# Patient Record
Sex: Female | Born: 1975 | Race: White | Hispanic: No | Marital: Single | Smoking: Never smoker
Health system: Southern US, Community
[De-identification: ages and names within clinical notes are randomized; demographics above are authoritative.]

## PROBLEM LIST (undated history)

## (undated) DIAGNOSIS — R600 Localized edema: Secondary | ICD-10-CM

## (undated) DIAGNOSIS — E785 Hyperlipidemia, unspecified: Secondary | ICD-10-CM

## (undated) DIAGNOSIS — K219 Gastro-esophageal reflux disease without esophagitis: Secondary | ICD-10-CM

## (undated) DIAGNOSIS — E049 Nontoxic goiter, unspecified: Secondary | ICD-10-CM

## (undated) DIAGNOSIS — B977 Papillomavirus as the cause of diseases classified elsewhere: Secondary | ICD-10-CM

## (undated) DIAGNOSIS — D721 Eosinophilia, unspecified: Secondary | ICD-10-CM

## (undated) DIAGNOSIS — A630 Anogenital (venereal) warts: Secondary | ICD-10-CM

## (undated) HISTORY — PX: WISDOM TOOTH EXTRACTION: SHX21

## (undated) HISTORY — PX: UPPER GASTROINTESTINAL ENDOSCOPY: SHX188

## (undated) HISTORY — PX: ESOPHAGOGASTRODUODENOSCOPY ENDOSCOPY: SHX5814

## (undated) HISTORY — DX: Eosinophilia, unspecified: D72.10

## (undated) HISTORY — PX: OTHER SURGICAL HISTORY: SHX169

## (undated) HISTORY — PX: EYE SURGERY: SHX253

## (undated) HISTORY — DX: Nontoxic goiter, unspecified: E04.9

## (undated) HISTORY — DX: Papillomavirus as the cause of diseases classified elsewhere: B97.7

## (undated) HISTORY — DX: Eosinophilia: D72.1

## (undated) HISTORY — DX: Gastro-esophageal reflux disease without esophagitis: K21.9

---

## 2013-02-16 ENCOUNTER — Ambulatory Visit (INDEPENDENT_AMBULATORY_CARE_PROVIDER_SITE_OTHER): Payer: BC Managed Care – PPO | Admitting: Advanced Practice Midwife

## 2013-02-16 ENCOUNTER — Encounter: Payer: Self-pay | Admitting: Advanced Practice Midwife

## 2013-02-16 VITALS — BP 112/69 | HR 88 | Resp 16 | Ht 64.0 in | Wt 133.0 lb

## 2013-02-16 DIAGNOSIS — Z3009 Encounter for other general counseling and advice on contraception: Secondary | ICD-10-CM

## 2013-02-16 DIAGNOSIS — Z113 Encounter for screening for infections with a predominantly sexual mode of transmission: Secondary | ICD-10-CM

## 2013-02-16 MED ORDER — MISOPROSTOL 200 MCG PO TABS: 600.0000 ug | ORAL_TABLET | Freq: Once | ORAL | Status: DC

## 2013-02-16 NOTE — Progress Notes (Signed)
  This is a 37 y.o. female G0P0 who is here for a contraception consult  Has used OCPs in past, but had weight gain and does not want to continue them.   She states she never wants to become pregnant. Wants a long-acting method of contraception  Has read up on various methods and wants to have Mirena IUD inserted  We discussed risks/benefits.  Discussed  Alternative of using Skyla, but patient wishes to try Mirena instead.  We will plan to have her insert cytotec the night before and take ibuprofen that morning.   Instructed her that we may have trouble getting it placed, she is still wanting to try.   Wants STD testing prior to insertion. Cultures done and bloodwork drawn. Also wants HSV glycoprotein testing.

## 2013-02-16 NOTE — Patient Instructions (Signed)

## 2013-02-17 LAB — HEPATITIS B SURFACE ANTIGEN: Hepatitis B Surface Ag: NEGATIVE

## 2013-02-17 LAB — HSV 2 ANTIBODY, IGG: HSV 2 Glycoprotein G Ab, IgG: <0.1 IV

## 2013-02-17 LAB — GC/CHLAMYDIA PROBE AMP, URINE: Chlamydia, Swab/Urine, PCR: NEGATIVE

## 2013-02-17 LAB — HSV 1 ANTIBODY, IGG: HSV 1 Glycoprotein G Ab, IgG: 0.35 IV

## 2013-02-21 ENCOUNTER — Encounter: Payer: Self-pay | Admitting: Advanced Practice Midwife

## 2013-02-21 ENCOUNTER — Ambulatory Visit (INDEPENDENT_AMBULATORY_CARE_PROVIDER_SITE_OTHER): Payer: BC Managed Care – PPO | Admitting: Advanced Practice Midwife

## 2013-02-21 VITALS — BP 125/81 | HR 78 | Resp 16 | Ht 64.0 in | Wt 131.0 lb

## 2013-02-21 DIAGNOSIS — Z01812 Encounter for preprocedural laboratory examination: Secondary | ICD-10-CM

## 2013-02-21 DIAGNOSIS — Z30430 Encounter for insertion of intrauterine contraceptive device: Secondary | ICD-10-CM

## 2013-02-21 DIAGNOSIS — Z3009 Encounter for other general counseling and advice on contraception: Secondary | ICD-10-CM

## 2013-02-21 LAB — POCT URINE PREGNANCY: Preg Test, Ur: NEGATIVE

## 2013-02-23 NOTE — Patient Instructions (Signed)

## 2013-02-23 NOTE — Progress Notes (Signed)
  Subjective:    Patient ID: Barbara Bauer, female    DOB: [birth year], 37 y.o.   MRN: [id number]  HPI This is a 37 y.o. female who is here for placement of a Mirena IUD. She was seen by me last week and requested placement. I cautioned her that insertion in a nullipara can be difficult. She did place cytotec last night in her vagina. Has had some cramping.    This is a 37 y.o. female G0P0 who is here for a contraception consult  Has used OCPs in past, but had weight gain and does not want to continue them.  She states she never wants to become pregnant. Wants a long-acting method of contraception  Has read up on various methods and wants to have Mirena IUD inserted  We discussed risks/benefits.  Discussed Alternative of using Skyla, but patient wishes to try Mirena instead.  We will plan to have her insert cytotec the night before and take ibuprofen that morning.  Instructed her that we may have trouble getting it placed, she is still wanting to try.  Wants STD testing prior to insertion. Cultures done and bloodwork drawn. Also wants HSV glycoprotein testing.        Review of Systems  Constitutional: Negative for fever and chills.  Genitourinary: Negative for vaginal discharge, difficulty urinating, menstrual problem and pelvic pain.       Objective:   Physical Exam  Constitutional: She is oriented to person, place, and time. She appears well-developed and well-nourished. No distress.  Cardiovascular: Normal rate.   Pulmonary/Chest: Effort normal.  Abdominal: Soft. She exhibits no distension. There is no tenderness. There is no rebound and no guarding.  Genitourinary: Vagina normal and uterus normal. No vaginal discharge (remnants of cytotec in vault) found.  Musculoskeletal: Normal range of motion.  Neurological: She is alert and oriented to person, place, and time.  Skin: Skin is warm and dry.  Psychiatric: She has a normal mood and affect.   Patient identified, informed  consent performed, signed copy in chart, time out was performed.  Urine pregnancy test negative.  Speculum placed in the vagina.  Cervix visualized.  Cleaned with Betadine x 2.  Grasped anteriourly with a single tooth tenaculum.    Uterus sound attempted with no success. Could not get it through internal os. Attempted with plastic dilators and could not pass it through internal os.    At that time, I informed patient I did not want to force it and she agreed to try again with one of the physicians.  Will repeat the cytotec the night before.            Assessment & Plan:  A:  Desires Mirena IUD  P:  Will reschedule placement with physician       Informed her if that is not successful, we may have to choose another method

## 2013-03-03 ENCOUNTER — Ambulatory Visit (INDEPENDENT_AMBULATORY_CARE_PROVIDER_SITE_OTHER): Payer: BC Managed Care – PPO | Admitting: Obstetrics & Gynecology

## 2013-03-03 ENCOUNTER — Encounter: Payer: Self-pay | Admitting: Obstetrics & Gynecology

## 2013-03-03 ENCOUNTER — Ambulatory Visit: Payer: BC Managed Care – PPO | Admitting: Obstetrics & Gynecology

## 2013-03-03 VITALS — BP 121/75 | HR 74 | Resp 16 | Ht 64.0 in | Wt 135.0 lb

## 2013-03-03 DIAGNOSIS — Z975 Presence of (intrauterine) contraceptive device: Secondary | ICD-10-CM

## 2013-03-03 DIAGNOSIS — Z01812 Encounter for preprocedural laboratory examination: Secondary | ICD-10-CM

## 2013-03-03 DIAGNOSIS — Z30430 Encounter for insertion of intrauterine contraceptive device: Secondary | ICD-10-CM

## 2013-03-03 MED ORDER — LEVONORGESTREL 13.5 MG IU IUD
1.0000 | INTRAUTERINE_SYSTEM | Freq: Once | INTRAUTERINE | Status: AC
  Administered 2013-03-03: 1 via INTRAUTERINE

## 2013-03-03 NOTE — Progress Notes (Signed)
  Subjective:    Patient ID: Barbara Bauer, female    DOB: [birth year], 37 y.o.   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 She took cytotec pv last night.  Review of Systems All STI testing negative    Objective:   Physical Exam A bimanual exam revealed a midplane NSS uterus with a normal adnexal exam UPT negative, consent signed, Time out procedure done. Cervix prepped with betadine and grasped with a single tooth tenaculum. Barbara Bauer was easily placed and the strings were cut to 3-4 cm. Uterus sounded to 7 cm. She tolerated the procedure well.       Assessment & Plan:  Contraception- Skyla as above We discussed condom use for STI prevention RTC 1 month for string check

## 2013-03-03 NOTE — Patient Instructions (Signed)
Intrauterine Device Insertion Care After Refer to this sheet in the next few weeks. These instructions provide you with information on caring for yourself after your procedure. Your caregiver may also give you more specific instructions. Your treatment has been planned according to current medical practices, but problems sometimes occur. Call your caregiver if you have any problems or questions after your procedure. HOME CARE INSTRUCTIONS   Only take over-the-counter or prescription medicines for pain, discomfort, or fever as directed by your caregiver. Do not use aspirin. This may increase bleeding.  Check your IUD to make sure it is in place before you resume sexual activity. You should be able to feel the strings. If you cannot feel the strings, something may be wrong. The IUD may have fallen out of the uterus, or the uterus may have been punctured (perforated) during placement. Also, if the strings are getting longer, it may mean that the IUD is being forced out of the uterus. You no longer have full protection from pregnancy if any of these problems occur.  You may resume sexual intercourse if you are not having problems with the IUD. The IUD is considered immediately effective.  You may resume normal activities.  Keep all follow-up appointments to be sure your IUD has remained in place. After the first exam, yearly exams are advised, unless you cannot feel the strings of your IUD.  Continue to check that the IUD is still in place by feeling for the strings after every menstrual period. SEEK MEDICAL CARE IF:   You have bleeding that is heavier or lasts longer than a normal menstrual cycle.  You have a fever.  You have increasing cramps or abdominal pain not relieved with medicine.  You have abdominal pain that does not seem to be related to the same area of earlier cramping and pain.  You are lightheaded, unusually weak, or faint.  You have abnormal vaginal discharge or  smells.  You have pain during sexual intercourse.  You cannot feel the IUD strings, or the IUD string has gotten longer.  You feel the IUD at the opening of the cervix in the vagina.  You think you are pregnant, or you miss your menstrual period.  The IUD string is hurting your sex partner. Document Released: 12/11/2010 Document Revised: 07/07/2011 Document Reviewed: 10/03/2012 Exit[employer] Patient Information 2014 [employer].  

## 2013-03-14 ENCOUNTER — Encounter: Payer: Self-pay | Admitting: Physician Assistant

## 2013-03-14 ENCOUNTER — Ambulatory Visit (INDEPENDENT_AMBULATORY_CARE_PROVIDER_SITE_OTHER): Payer: BC Managed Care – PPO | Admitting: Physician Assistant

## 2013-03-14 VITALS — BP 112/64 | HR 72 | Ht 64.0 in | Wt 134.0 lb

## 2013-03-14 DIAGNOSIS — Z131 Encounter for screening for diabetes mellitus: Secondary | ICD-10-CM

## 2013-03-14 DIAGNOSIS — K219 Gastro-esophageal reflux disease without esophagitis: Secondary | ICD-10-CM

## 2013-03-14 DIAGNOSIS — E049 Nontoxic goiter, unspecified: Secondary | ICD-10-CM

## 2013-03-14 DIAGNOSIS — Z13220 Encounter for screening for lipoid disorders: Secondary | ICD-10-CM

## 2013-03-14 DIAGNOSIS — Z87898 Personal history of other specified conditions: Secondary | ICD-10-CM

## 2013-03-14 MED ORDER — OMEPRAZOLE 40 MG PO CPDR: 40.0000 mg | DELAYED_RELEASE_CAPSULE | Freq: Every day | ORAL | Status: DC

## 2013-03-16 ENCOUNTER — Encounter: Payer: Self-pay | Admitting: Physician Assistant

## 2013-03-16 DIAGNOSIS — K219 Gastro-esophageal reflux disease without esophagitis: Secondary | ICD-10-CM | POA: Insufficient documentation

## 2013-03-16 DIAGNOSIS — E049 Nontoxic goiter, unspecified: Secondary | ICD-10-CM | POA: Insufficient documentation

## 2013-03-16 DIAGNOSIS — Z87898 Personal history of other specified conditions: Secondary | ICD-10-CM | POA: Insufficient documentation

## 2013-03-16 NOTE — Progress Notes (Signed)
  Subjective:    Patient ID: Barbara Bauer, female    DOB: 11-13-1975, 37 y.o.   MRN: [id number]  HPI Patient is a 37 yo WF who presents to the clinic to establish care. PMH positive for GERD and unspecific thyroid issues.   Last Pap was 2013 and normal but she has had past hx of dysplasia that resolved.   Family hx postiv for breast cancer of grandmother in her 17's and MI.   She would like to find something cheaper for acid reflux other than nexium. Nexium controls symptoms but would like to try something else. She has tried to come off nexium and symptoms come right back.   She has been to many doctors about her thyroid. Her levels have never been abnormal but she does have a goiter that has been evaluated. She has had scan that per pt stated benign goiter. She would like to keep a close follow up on this problem.  She is always cold. She does have skin changes but they go back and forth oily to dry.    Review of Systems  All other systems reviewed and are negative.       Objective:   Physical Exam  Constitutional: She is oriented to person, place, and time. She appears well-developed and well-nourished.  HENT:  Head: Normocephalic and atraumatic.  Neck: Normal range of motion. Neck supple. Thyromegaly present.  Enlarged thyroid. Per palpation right larger than left side.   Cardiovascular: Normal rate, regular rhythm and normal heart sounds.   Pulmonary/Chest: Effort normal and breath sounds normal.  Neurological: She is alert and oriented to person, place, and time.  Skin: Skin is warm and dry.  Psychiatric: She has a normal mood and affect. Her behavior is normal.          Assessment & Plan:  gERD- stop nexium. Start omeprazole to be cheaper. Consider taking every other day or every 3 days. Discussed risk of osteoporosis. Add calcium and vitamin D to diet but do not take with PPI. Consider diet changes.   Thyroid goiter- Will wait to get record to see recommendations.  Will check TSH levels. Will continue to monitor any symptoms changes.   Fasting labs given in preperaration for CPE/PAP upcoming.

## 2013-03-21 ENCOUNTER — Encounter: Payer: BC Managed Care – PPO | Admitting: Physician Assistant

## 2013-03-21 LAB — LIPID PANEL
Cholesterol: 188 mg/dL (ref 0–200)
HDL: 65 mg/dL (ref >39)
Total CHOL/HDL Ratio: 2.9 Ratio
Triglycerides: 64 mg/dL (ref <150)
VLDL: 13 mg/dL (ref 0–40)

## 2013-03-21 LAB — COMPLETE METABOLIC PANEL WITH GFR
AST: 21 U/L (ref 0–37)
Albumin: 4.5 g/dL (ref 3.5–5.2)
Alkaline Phosphatase: 57 U/L (ref 39–117)
BUN: 10 mg/dL (ref 6–23)
GFR, Est Non African American: 85 mL/min
Glucose, Bld: 95 mg/dL (ref 70–99)
Potassium: 4.3 mEq/L (ref 3.5–5.3)
Sodium: 136 mEq/L (ref 135–145)
Total Bilirubin: 0.7 mg/dL (ref 0.3–1.2)
Total Protein: 7.4 g/dL (ref 6.0–8.3)

## 2013-03-21 LAB — T4, FREE: Free T4: 0.92 ng/dL (ref 0.80–1.80)

## 2013-03-21 LAB — T3 UPTAKE: T3 Uptake: 42 % — ABNORMAL HIGH (ref 22.5–37.0)

## 2013-03-21 LAB — TSH: TSH: 0.955 u[IU]/mL (ref 0.350–4.500)

## 2013-04-11 ENCOUNTER — Other Ambulatory Visit (HOSPITAL_COMMUNITY)
Admission: RE | Admit: 2013-04-11 | Discharge: 2013-04-11 | Disposition: A | Discharge status: Home / Self Care | Payer: BC Managed Care – PPO | Source: Ambulatory Visit | Attending: Family Medicine | Admitting: Family Medicine

## 2013-04-11 ENCOUNTER — Ambulatory Visit (INDEPENDENT_AMBULATORY_CARE_PROVIDER_SITE_OTHER): Payer: BC Managed Care – PPO | Admitting: Physician Assistant

## 2013-04-11 ENCOUNTER — Encounter: Payer: Self-pay | Admitting: Physician Assistant

## 2013-04-11 VITALS — BP 122/72 | HR 79 | Wt 130.0 lb

## 2013-04-11 DIAGNOSIS — R87810 Cervical high risk human papillomavirus (HPV) DNA test positive: Secondary | ICD-10-CM | POA: Insufficient documentation

## 2013-04-11 DIAGNOSIS — Z113 Encounter for screening for infections with a predominantly sexual mode of transmission: Secondary | ICD-10-CM | POA: Insufficient documentation

## 2013-04-11 DIAGNOSIS — N760 Acute vaginitis: Secondary | ICD-10-CM | POA: Insufficient documentation

## 2013-04-11 DIAGNOSIS — D239 Other benign neoplasm of skin, unspecified: Secondary | ICD-10-CM

## 2013-04-11 DIAGNOSIS — L821 Other seborrheic keratosis: Secondary | ICD-10-CM

## 2013-04-11 DIAGNOSIS — Z01419 Encounter for gynecological examination (general) (routine) without abnormal findings: Secondary | ICD-10-CM

## 2013-04-11 DIAGNOSIS — D229 Melanocytic nevi, unspecified: Secondary | ICD-10-CM

## 2013-04-11 DIAGNOSIS — E049 Nontoxic goiter, unspecified: Secondary | ICD-10-CM

## 2013-04-11 DIAGNOSIS — Z1151 Encounter for screening for human papillomavirus (HPV): Secondary | ICD-10-CM | POA: Insufficient documentation

## 2013-04-11 NOTE — Progress Notes (Signed)
  Subjective:     Barbara Bauer is a 37 y.o. female and is here for a comprehensive physical exam. The patient reports no problems.  History   Social History  . Marital Status: Unknown    Spouse Name: N/A    Number of Children: N/A  . Years of Education: N/A   Occupational History  . professor    Social History Main Topics  . Smoking status: Never Smoker   . Smokeless tobacco: Never Used  . Alcohol Use: Yes     Comment: minimal  . Drug Use: No  . Sexual Activity: Yes    Partners: Male   Other Topics Concern  . Not on file   Social History Narrative  . No narrative on file   Health Maintenance  Topic Date Due  . Influenza Vaccine  03/14/2014  . Pap Smear  04/28/2014  . Tetanus/tdap  04/28/2020    The following portions of the patient's history were reviewed and updated as appropriate: allergies, current medications, past family history, past medical history, past social history, past surgical history and problem list.  Review of Systems A comprehensive review of systems was negative.   Objective:    BP 122/72  Pulse 79  Wt 130 lb (58.968 kg)  LMP 02/26/2013 General appearance: alert, cooperative and appears stated age Head: Normocephalic, without obvious abnormality, atraumatic Eyes: conjunctivae/corneas clear. PERRL, EOM's intact. Fundi benign. Ears: normal TM's and external ear canals both ears Nose: Nares normal. Septum midline. Mucosa normal. No drainage or sinus tenderness. Throat: lips, mucosa, and tongue normal; teeth and gums normal Neck: no adenopathy, no carotid bruit, no JVD, supple, symmetrical, trachea midline and Right sided thyroid goiter Back: symmetric, no curvature. ROM normal. No CVA tenderness. Lungs: clear to auscultation bilaterally Heart: regular rate and rhythm, S1, S2 normal, no murmur, click, rub or gallop Abdomen: soft, non-tender; bowel sounds normal; no masses,  no organomegaly Pelvic: cervix normal in appearance, external  genitalia normal, no adnexal masses or tenderness, no cervical motion tenderness, uterus normal size, shape, and consistency and vagina normal without discharge IUD strings visulized Extremities: extremities normal, atraumatic, no cyanosis or edema Pulses: 2+ and symmetric Skin: Skin color, texture, turgor normal. No rashes or lesions or Seborrheic keratosis that appears to be more pigmented than normal on left side of mid back. Lymph nodes: Cervical, supraclavicular, and axillary nodes normal. Neurologic: Grossly normal    Assessment:    Healthy female exam.      Plan:     CPE-Pap smear done today. Added STD panel 2 to sexual activity in the last month. Encouraged condom usage. Gave CPE handout with encouragement of calcium and vitamin D. Vaccines are up to day. Regular exercise at least 150 minutes of exercise a week was given.  Seborrheic keratosis/atypical skin lesion-discuss with patient to make appointment to have removed and sent off. I discussed with patient that seborrheic keratosis are normal however it did not like the color of this particular one.  Thyroid goiter-I would like to recheck levels in 3 a short weeks to see if labs trending up or down or staying the same. See After Visit Summary for Counseling Recommendations

## 2013-04-11 NOTE — Patient Instructions (Signed)

## 2013-04-13 ENCOUNTER — Encounter: Payer: Self-pay | Admitting: Physician Assistant

## 2013-04-13 ENCOUNTER — Ambulatory Visit (INDEPENDENT_AMBULATORY_CARE_PROVIDER_SITE_OTHER): Payer: BC Managed Care – PPO | Admitting: Physician Assistant

## 2013-04-13 ENCOUNTER — Other Ambulatory Visit: Payer: Self-pay | Admitting: Physician Assistant

## 2013-04-13 VITALS — BP 115/71 | HR 74 | Wt 130.0 lb

## 2013-04-13 DIAGNOSIS — L819 Disorder of pigmentation, unspecified: Secondary | ICD-10-CM

## 2013-04-13 DIAGNOSIS — D229 Melanocytic nevi, unspecified: Secondary | ICD-10-CM

## 2013-04-13 DIAGNOSIS — IMO0002 Reserved for concepts with insufficient information to code with codable children: Secondary | ICD-10-CM | POA: Insufficient documentation

## 2013-04-13 DIAGNOSIS — L814 Other melanin hyperpigmentation: Secondary | ICD-10-CM

## 2013-04-13 DIAGNOSIS — D239 Other benign neoplasm of skin, unspecified: Secondary | ICD-10-CM

## 2013-04-13 MED ORDER — FLUCONAZOLE 150 MG PO TABS: 150.0000 mg | ORAL_TABLET | Freq: Once | ORAL | Status: DC

## 2013-04-13 MED ORDER — METRONIDAZOLE 500 MG PO TABS: 500.0000 mg | ORAL_TABLET | Freq: Two times a day (BID) | ORAL | Status: DC

## 2013-04-13 NOTE — Patient Instructions (Signed)
Keep clean with soap and water. Bactroban for next 24 hours. Will call with bx results.

## 2013-04-13 NOTE — Progress Notes (Signed)
   Subjective:    Patient ID: Barbara Bauer, female    DOB: Jul 30, 1975, 37 y.o.   MRN: [id number]  HPI Pt comes into to get one irritated nevus and atypical seborrheic keratosis with some dark hyperpigmentation.   Irritated nevus is on right internal thigh- bleeds when cut shaving and sometimes zipper knicks it.   SK on left side of back- no problems concerning color. Father did have melanoma.     Review of Systems     Objective:   Physical Exam  Skin:             Assessment & Plan:  SK with pigmentation/family hx of melanoma/pigmented nevus- Removed and sent SK for biopsy. Instructions given to clean with soap and water. bactroban given in office.   Shave Biopsy Procedure Note  Pre-operative Diagnosis: Suspicious lesion, and irritated pigmented nevus  Post-operative Diagnosis: same  Locations: right inner thigh and left mid to low back  Indications: suspicous/family hx of melanoma/irritated.  Anesthesia: Lidocaine 1% with epinephrine without added sodium bicarbonate  Procedure Details  History of allergy to iodine: no  Patient informed of the risks (including bleeding and infection) and benefits of the  procedure and Verbal informed consent obtained.  The lesion and surrounding area were given a sterile prep using betadyne and draped in the usual sterile fashion. A scalpel was used to shave an area of skin approximately 1cm by 1cm on the left mid back and .5cm by .5cm on right inner thigh.  Hemostasis achieved with alumuninum chloride. Antibiotic ointment and a sterile dressing applied.  one specimen was sent for pathologic examination the other was not. The patient tolerated the procedure well.  EBL: scant  Findings:   Condition: Stable  Complications: none.  Plan: 1. Instructed to keep the wound dry and covered for 24-48h and clean thereafter. 2. Warning signs of infection were reviewed.   3. Recommended that the patient use OTC acetaminophen as  needed for pain.

## 2013-05-06 ENCOUNTER — Ambulatory Visit (INDEPENDENT_AMBULATORY_CARE_PROVIDER_SITE_OTHER): Payer: BC Managed Care – PPO | Admitting: Family

## 2013-05-06 ENCOUNTER — Encounter: Payer: Self-pay | Admitting: Family

## 2013-05-06 VITALS — BP 125/75 | HR 87 | Resp 16 | Ht 65.0 in | Wt 128.0 lb

## 2013-05-06 DIAGNOSIS — Z113 Encounter for screening for infections with a predominantly sexual mode of transmission: Secondary | ICD-10-CM

## 2013-05-06 DIAGNOSIS — Z7251 High risk heterosexual behavior: Secondary | ICD-10-CM

## 2013-05-06 DIAGNOSIS — N898 Other specified noninflammatory disorders of vagina: Secondary | ICD-10-CM

## 2013-05-06 NOTE — Progress Notes (Signed)
  Subjective:    Barbara Bauer is a 38 y.o. female who presents for sexually transmitted disease check. Sexual history reviewed with the patient. STI Exposure: unknown. Previous history of STI HPV (unknown type). Recently divorced and has began dating.  Last unprotected intercourse was two months ago.  Current symptoms vaginal discharge: white and different odor..Denies vaginal itching and lesions.    Contraception: IUD Menstrual History: OB History   Grav Para Term Preterm Abortions TAB SAB Ect Mult Living   0 0 0 0 0 0 0 0 0 0      Patient's last menstrual period was 02/26/2013.    The following portions of the patient's history were reviewed and updated as appropriate: allergies, current medications, past family history, past medical history, past social history, past surgical history and problem list.  Review of Systems Pertinent items are noted in HPI.    Objective:    BP 125/75  Pulse 87  Resp 16  Ht 5\' 5"  (1.651 m)  Wt 128 lb (58.06 kg)  BMI 21.30 kg/m2  LMP 02/26/2013 General:   alert, cooperative and appears stated age  Lymph Nodes:   Normal  Pelvis:  Vulva and vagina appear normal. Bimanual exam reveals normal uterus and adnexa. Small amount of white discharge.  Cultures:  GC and Chlamydia genprobes and affirm wet prep.     Assessment:    Possible STD exposure    Plan:    Discussed safe sexual practice in detail  GC/CT; Wet prep Affirm] Bloodwork:  HSV, hep B, RPR, HIV  [facility]

## 2013-05-07 LAB — RPR

## 2013-05-07 LAB — WET PREP BY MOLECULAR PROBE
Candida species: NEGATIVE
Gardnerella vaginalis: NEGATIVE
Trichomonas vaginosis: NEGATIVE

## 2013-05-07 LAB — HEPATITIS C ANTIBODY: HCV AB: NEGATIVE

## 2013-05-07 LAB — GC/CHLAMYDIA PROBE AMP
CT Probe RNA: NEGATIVE
GC Probe RNA: NEGATIVE

## 2013-05-07 LAB — HIV ANTIBODY (ROUTINE TESTING W REFLEX): HIV: NONREACTIVE

## 2013-05-07 LAB — HEPATITIS B SURFACE ANTIGEN: HEP B S AG: NEGATIVE

## 2013-05-08 NOTE — Progress Notes (Signed)
Pt called and notified regarding screening results.  HSV I & II pending.

## 2013-05-09 ENCOUNTER — Telehealth: Payer: Self-pay | Admitting: *Deleted

## 2013-05-09 LAB — HSV 2 ANTIBODY, IGG: HSV 2 Glycoprotein G Ab, IgG: <0.1 IV

## 2013-05-09 LAB — HSV 1 ANTIBODY, IGG: HSV 1 Glycoprotein G Ab, IgG: <0.1 IV

## 2013-05-09 NOTE — Telephone Encounter (Signed)
LM on cell voicemail that her recent labs were WNL.

## 2013-09-26 ENCOUNTER — Encounter: Payer: Self-pay | Admitting: Advanced Practice Midwife

## 2013-09-26 ENCOUNTER — Ambulatory Visit (INDEPENDENT_AMBULATORY_CARE_PROVIDER_SITE_OTHER): Payer: BC Managed Care – PPO | Admitting: Advanced Practice Midwife

## 2013-09-26 VITALS — BP 127/71 | HR 84 | Resp 16 | Ht 64.0 in | Wt 131.0 lb

## 2013-09-26 DIAGNOSIS — Z113 Encounter for screening for infections with a predominantly sexual mode of transmission: Secondary | ICD-10-CM

## 2013-09-26 DIAGNOSIS — Z7251 High risk heterosexual behavior: Secondary | ICD-10-CM

## 2013-09-26 NOTE — Progress Notes (Signed)
   Subjective:    Patient ID: Barbara Bauer, female    DOB: [birth year], 38 y.o.   MRN: [id number]  HPI: Here for STD testing. Noticed a painless bump on labia three months ago, resolved spontaneously. Three painless, NT bumps appeared on labia majora three days ago after shaving. She states she has been in a mutually monogamous relationship x 3 months.   Review of Systems Neg for fever, chills, dyspareunia, vaginal discharge, intermenstrual bleeding or painful genital lesions.    Objective:   Physical Exam General: NAD, A&O x 4 ABD: Soft, NT, no masses. Inguinal nodes NP.  PELVIC: NEFG except 4 tiny 1 mm NT raised flesh-colored pearly bumps on right and left labia majora C/W sebacous cysts. No drainage. Physiologic discharge. Declined repeat spec exam (last exam 04/2013).    Assessment & Plan:  High risk sexual behavior - Plan: GC/Chlamydia Probe Amp, HIV Antibody ( Reflex), RPR, Hepatitis B Surface AntiGEN, Hepatitis C Antibody, Herpes simplex virus culture  Annual Gyn exam in 1 year. Encouraged safe sex practices.

## 2013-09-27 ENCOUNTER — Telehealth: Payer: Self-pay | Admitting: *Deleted

## 2013-09-27 LAB — HIV ANTIBODY (ROUTINE TESTING W REFLEX): HIV 1&2 Ab, 4th Generation: NONREACTIVE

## 2013-09-27 LAB — RPR

## 2013-09-27 LAB — HEPATITIS B SURFACE ANTIGEN: Hepatitis B Surface Ag: NEGATIVE

## 2013-09-27 LAB — GC/CHLAMYDIA PROBE AMP
CT PROBE, AMP APTIMA: NEGATIVE
GC PROBE AMP APTIMA: NEGATIVE

## 2013-09-27 LAB — HEPATITIS C ANTIBODY: HCV Ab: NEGATIVE

## 2013-09-27 NOTE — Telephone Encounter (Signed)
Called pt to adv all blood labs are WNL still waiting on culture and gc/chlam to be resulted - [facility] for pt to rtn call.

## 2013-09-28 LAB — HERPES SIMPLEX VIRUS CULTURE: Organism ID, Bacteria: NOT DETECTED

## 2013-09-29 ENCOUNTER — Telehealth: Payer: Self-pay | Admitting: *Deleted

## 2013-09-29 NOTE — Telephone Encounter (Signed)
Called pt to adv HSV culture was negative  - Pt expressed understanding

## 2013-10-27 ENCOUNTER — Ambulatory Visit (INDEPENDENT_AMBULATORY_CARE_PROVIDER_SITE_OTHER): Payer: BC Managed Care – PPO | Admitting: Obstetrics & Gynecology

## 2013-10-27 ENCOUNTER — Encounter: Payer: Self-pay | Admitting: Obstetrics & Gynecology

## 2013-10-27 VITALS — BP 121/77 | HR 88 | Resp 16 | Ht 64.0 in | Wt 133.0 lb

## 2013-10-27 DIAGNOSIS — N9089 Other specified noninflammatory disorders of vulva and perineum: Secondary | ICD-10-CM

## 2013-10-27 MED ORDER — IMIQUIMOD 5 % EX CREA: TOPICAL_CREAM | CUTANEOUS | Status: DC

## 2013-10-27 NOTE — Progress Notes (Signed)
   CLINIC ENCOUNTER NOTE  History:  38 y.o. G0 here today for re-revaluation of vulvar lesions. Was seen on 10/06/13, please refer to that note for more details. She feels the lesions are more prominent and have also appeared more.  She is worried about genital warts, never had these before and does not think her partner has any.  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   The following portions of the patient's history were reviewed and updated as appropriate: allergies, current medications, past family history, past medical history, past social history, past surgical history and problem list. Normal cytology, positive HRHPV in 03/2013; patient knows she needs repeat cotesting in one year.  Review of Systems:  A comprehensive review of systems was negative.  Objective:  Physical Exam BP 121/77  Pulse 88  Resp 16  Ht 5\' 4"  (1.626 m)  Wt 133 lb (60.328 kg)  BMI 22.82 kg/m2 Gen: NAD Abd: Soft, nontender and nondistended Pelvic: Normal appearing external genitalia except for two 3 mm flesh colored papules on left labium majus, one 2 mm lesion of left labium majus. Three smaller lesions (1 mm) noted on confluence of lateral part of left labium majus.  No erythema, no tenderness, no drainage.  Surface of lesions has irregular, possible warty appearance but this is difficult to see given small size of lesions.   Assessment & Plan:  Vulvar lesions concerning for possible genital warts, atypical appearance Recommended biopsy for clarification, patient defers this for now and just wants treatment.  Aldara prescribed.  Will reevaluate in two months; if not getting better, consider biopsy Routine preventative health maintenance measures emphasized.     Verita Schneiders, MD, [place] Attending [place] for [employer], [place]

## 2013-12-07 ENCOUNTER — Ambulatory Visit (INDEPENDENT_AMBULATORY_CARE_PROVIDER_SITE_OTHER): Payer: BC Managed Care – PPO | Admitting: Podiatrist

## 2013-12-07 ENCOUNTER — Encounter: Payer: Self-pay | Admitting: Podiatrist

## 2013-12-07 VITALS — BP 104/55 | HR 82 | Resp 18

## 2013-12-07 DIAGNOSIS — B351 Tinea unguium: Secondary | ICD-10-CM

## 2013-12-07 MED ORDER — EFINACONAZOLE 10 % EX SOLN: 1.0000 [drp] | Freq: Every day | CUTANEOUS | Status: DC

## 2013-12-07 NOTE — Progress Notes (Signed)
   Subjective:    Patient ID: Barbara Bauer, female    DOB: August 14, 1975, 38 y.o.   MRN: [id number]  HPI left big toenail has some discolored and has been going on for about 5 years ago and have done lamisil twice and I could not tell a difference and then I used tree oil     Review of Systems  Hematological: Bruises/bleeds easily.  All other systems reviewed and are negative.      Objective:   Physical Exam Patient is awake, alert, and oriented x 3.  In no acute distress.  Vascular status is intact with palpable pedal pulses at 2/4 DP and PT bilateral and capillary refill time within normal limits. Neurological sensation is also intact bilaterally via Semmes Weinstein monofilament at 5/5 sites. Light touch, vibratory sensation, Achilles tendon reflex is intact. Dermatological exam reveals skin color, turger and texture as normal. No open lesions present.  Musculature intact with dorsiflexion, plantarflexion, inversion, eversion.  The left great toenail medial nail border has some discoloration consistent with onychomycosis. It has been cut short and therefore a sample is unable to be obtained. It has yellowish brownish discoloration and start to just behind the proximal nail fold distally.     Assessment & Plan:  Onychomycosis left great toenail  Plan: She states that her initial culture taken was negative however she was Put on Lamisil for clinical suspicion 2 rounds. She states that he tree oil has helped the most. I recommended a topical antifungal of Jublia. It is not improved in 3 months she will call and we will consider laser therapy. We did discuss this today however I would like to try a topical first.

## 2013-12-07 NOTE — Patient Instructions (Signed)
I am calling you in a medication called Jublia to a pharmacy that will be able to get you the best price on this particular medication-- it is called [facility].  They will call to confirm that you are interested in the topical therapy-- they are located out of [place].  If you do not hear from them within 2-3 days, please let our office know.  You apply the topical to unpolished nails daily.    

## 2014-03-15 ENCOUNTER — Encounter: Payer: BC Managed Care – PPO | Admitting: Physician Assistant

## 2014-03-15 NOTE — Progress Notes (Signed)
This encounter was created in error - please disregard.

## 2014-04-12 ENCOUNTER — Encounter: Payer: Self-pay | Admitting: Physician Assistant

## 2014-04-12 ENCOUNTER — Other Ambulatory Visit (HOSPITAL_COMMUNITY)
Admission: RE | Admit: 2014-04-12 | Discharge: 2014-04-12 | Disposition: A | Discharge status: Home / Self Care | Payer: BC Managed Care – PPO | Source: Ambulatory Visit | Attending: Physician Assistant | Admitting: Physician Assistant

## 2014-04-12 ENCOUNTER — Ambulatory Visit (INDEPENDENT_AMBULATORY_CARE_PROVIDER_SITE_OTHER): Payer: BC Managed Care – PPO | Admitting: Physician Assistant

## 2014-04-12 VITALS — BP 115/70 | HR 83 | Ht 64.0 in | Wt 135.0 lb

## 2014-04-12 DIAGNOSIS — Z1151 Encounter for screening for human papillomavirus (HPV): Secondary | ICD-10-CM | POA: Diagnosis present

## 2014-04-12 DIAGNOSIS — Z01419 Encounter for gynecological examination (general) (routine) without abnormal findings: Secondary | ICD-10-CM | POA: Diagnosis not present

## 2014-04-12 DIAGNOSIS — Z01411 Encounter for gynecological examination (general) (routine) with abnormal findings: Secondary | ICD-10-CM | POA: Diagnosis not present

## 2014-04-12 DIAGNOSIS — R87810 Cervical high risk human papillomavirus (HPV) DNA test positive: Secondary | ICD-10-CM | POA: Insufficient documentation

## 2014-04-12 DIAGNOSIS — R87811 Vaginal high risk human papillomavirus (HPV) DNA test positive: Secondary | ICD-10-CM

## 2014-04-12 NOTE — Progress Notes (Signed)
  Subjective:     Barbara Bauer is a 38 y.o. woman who comes in today for a  pap smear only. Her most recent annual exam was on 09/26/2013([facility]). Her most recent Pap smear was on 04/10/2014 and showed HPV positive without cellular changes. . Previous abnormal Pap smears: no. Contraception: IUD  The following portions of the patient's history were reviewed and updated as appropriate: allergies, current medications, past family history, past medical history, past social history, past surgical history and problem list.  Review of Systems A comprehensive review of systems was negative.   Objective:    BP 115/70 mmHg  Pulse 83  Ht 5\' 4"  (1.626 m)  Wt 135 lb (61.236 kg)  BMI 23.16 kg/m2  LMP 03/29/2014 Pelvic Exam: cervix normal in appearance, external genitalia normal and vagina normal without discharge. Pap smear obtained.   Assessment:    Screening pap smear.   Plan:    Follow up in 3 years, or as indicated by Pap results pt declined STD testing due to monogamous relationship.

## 2014-04-13 LAB — CYTOLOGY - PAP

## 2014-04-18 ENCOUNTER — Other Ambulatory Visit: Payer: Self-pay | Admitting: Physician Assistant

## 2014-04-18 ENCOUNTER — Telehealth: Payer: Self-pay

## 2014-04-18 DIAGNOSIS — N870 Mild cervical dysplasia: Secondary | ICD-10-CM | POA: Insufficient documentation

## 2014-04-18 DIAGNOSIS — R87612 Low grade squamous intraepithelial lesion on cytologic smear of cervix (LGSIL): Secondary | ICD-10-CM

## 2014-04-18 DIAGNOSIS — R87618 Other abnormal cytological findings on specimens from cervix uteri: Secondary | ICD-10-CM

## 2014-04-18 DIAGNOSIS — R8789 Other abnormal findings in specimens from female genital organs: Secondary | ICD-10-CM | POA: Insufficient documentation

## 2014-04-18 NOTE — Telephone Encounter (Signed)
Left message for patient to call office to schedule appointment.

## 2014-04-27 ENCOUNTER — Ambulatory Visit (INDEPENDENT_AMBULATORY_CARE_PROVIDER_SITE_OTHER): Payer: BC Managed Care – PPO | Admitting: Podiatrist

## 2014-04-27 VITALS — BP 112/68 | HR 80 | Resp 16

## 2014-04-27 DIAGNOSIS — B351 Tinea unguium: Secondary | ICD-10-CM

## 2014-04-27 MED ORDER — FLUCONAZOLE 150 MG PO TABS: 150.0000 mg | ORAL_TABLET | Freq: Once | ORAL | Status: DC

## 2014-04-27 NOTE — Progress Notes (Signed)
  Subjective: Patient presents today for follow-up of left hallux nail medial nail border. She relates that she's been using the topical Jublia and has noticed no improvement. She also states that she's had this infection for several years and that she has failed 2 rounds of oral Lamisil treatment. She also relates that she recently was diagnosed with a overgrowth of yeast in her system and wonders if this could be playing a part in her toenail problem.  Objective: Neurovascular status is intact and unchanged. Left hallux nail medial nail border has an area of yellowish discoloration with subungual debris noted beneath. It does appear to be fungal. No other toenails are affected.  Assessment: Onychomycosis likely yeast in nature  Plan: Took a sample of the toenail to send for identification of toenail fungus. Hopefully this will be able to be performed since she has been using the topical. Did however recommend starting her on Diflucan 150 one tab by mouth weekly and watch the nail closely for new growth. A printed prescription was written so she may take this to [facility] to have it filled. If there is no improvement in 3-6 months she is instructed to call. We will call with the result of the culture.Marland Kitchen

## 2014-05-04 ENCOUNTER — Encounter: Payer: BC Managed Care – PPO | Admitting: Obstetrics & Gynecology

## 2014-05-16 ENCOUNTER — Encounter: Payer: Self-pay | Admitting: Podiatry

## 2014-05-16 ENCOUNTER — Telehealth: Payer: Self-pay | Admitting: *Deleted

## 2014-05-16 ENCOUNTER — Encounter: Payer: Self-pay | Admitting: Podiatrist

## 2014-05-16 NOTE — Telephone Encounter (Signed)
-----   Message from Bronson Ing, DPM sent at 05/16/2014  9:51 AM EST ----- Regarding: toenail culture Camaya's nail sample is positive for a mold-- the diflucan she is on does treat mold but it is not the main medication I usually use-- I would recommend that she try the diflucan for 3-6 months and if it doesn't look any different, call me back and I will switch her over to the medication I usually use for mold in the culture.  Thanks!  E  (she already has the diflucan rx)

## 2014-05-16 NOTE — Telephone Encounter (Signed)
I called and left her a message that her culture did come back positive for mold.  Diflucan that you are taking can treat the problem but it's not what she normally prescribes.  She said to take the Diflucan for 3-6 months and if not noticing any improvement she can prescribe the other medication.  Please give me a call back if you have any questions or concerns.

## 2014-05-17 ENCOUNTER — Ambulatory Visit (INDEPENDENT_AMBULATORY_CARE_PROVIDER_SITE_OTHER): Payer: BLUE CROSS/BLUE SHIELD | Admitting: Obstetrics & Gynecology

## 2014-05-17 ENCOUNTER — Encounter: Payer: Self-pay | Admitting: Obstetrics & Gynecology

## 2014-05-17 VITALS — BP 132/79 | HR 120 | Resp 16 | Ht 64.0 in | Wt 136.0 lb

## 2014-05-17 DIAGNOSIS — N870 Mild cervical dysplasia: Secondary | ICD-10-CM

## 2014-05-17 DIAGNOSIS — Z01812 Encounter for preprocedural laboratory examination: Secondary | ICD-10-CM

## 2014-05-17 DIAGNOSIS — R87612 Low grade squamous intraepithelial lesion on cytologic smear of cervix (LGSIL): Secondary | ICD-10-CM | POA: Diagnosis not present

## 2014-05-17 LAB — POCT URINE PREGNANCY: PREG TEST UR: NEGATIVE

## 2014-05-17 NOTE — Patient Instructions (Signed)
COLPOSCOPY POST-PROCEDURE INSTRUCTIONS  1. You may take Ibuprofen, Aleve or Tylenol for cramping if needed.  2. If Monsel's solution was used, you will have a black discharge.  3. Light bleeding is normal.  If bleeding is heavier than your period, please call.  4. Put nothing in your vagina until the bleeding or discharge stops (usually 2 or 3 days).  5. We will call you within one week with biopsy results or discuss the results at your follow-up appointment if needed.  

## 2014-05-17 NOTE — Progress Notes (Signed)
  Colposcopy Procedure Note  Indications: Pap smear 1 months ago showed: low-grade squamous intraepithelial neoplasia (LGSIL - encompassing HPV,mild dysplasia,CIN I). The prior pap showed nml cytology and HPV positive.  Prior cervical/vaginal disease: 11 years ago had abnml pap and had a colposcopy.  No LEEP or cryo done. Prior cervical treatment: no treatment.  Procedure Details  The risks and benefits of the procedure and Written informed consent obtained.  Speculum placed in vagina and excellent visualization of cervix achieved, cervix swabbed x 3 with acetic acid solution.  Findings: Cervix: acetowhite lesion(s) noted at 3 o'clock; cervix swabbed with Lugol's solution, SCJ visualized - lesion at 3 o'clock, endocervical curettage performed, cervical biopsies taken at 3 o'clock, specimen labelled and sent to pathology and hemostasis achieved with Monsel's solution.   IUD strings seen.  Specimens: cervical biopsy at 3 o'clock and ECC  Complications: none.  Plan: Specimens labelled and sent to Pathology. Will base further treatment on Pathology findings. Post biopsy instructions given to patient.

## 2014-05-17 NOTE — Addendum Note (Signed)
Addended by: Asencion Islam on: 05/17/2014 09:41 AM   Modules accepted: Orders

## 2014-05-18 ENCOUNTER — Telehealth: Payer: Self-pay | Admitting: *Deleted

## 2014-05-18 DIAGNOSIS — Z79899 Other long term (current) drug therapy: Secondary | ICD-10-CM

## 2014-05-18 NOTE — Telephone Encounter (Addendum)
Pt states she received the call concerning the lab results positive for mold and the use of Diflucan, but would like to try the preferred medication now, rather than wait 3 - 6 months.  Pt states she has been on Lamisil previously.  Pt states she would like to change her pharmacy to [facility].  Dr. Valentina Lucks ordered Onmel #28 1 tablet daily with 2 refills, many begin after the hepatic function results are given.  Orders to pt, with request to call and confirm the change in therapy.  Called pt checking if she had decided to take the [place].  Pt states she fell and was just trying to figure the logistic of getting everything together.  I told pt I would order the labs for initial screening and then the one for 30 days into the Onmel therapy and mail copies as reminders to perform the labs.  I instructed pt once she had received a good lab report, then I would call in the [place] #28 +2 refills to [facility].

## 2014-05-19 ENCOUNTER — Encounter: Payer: Self-pay | Admitting: Obstetrics & Gynecology

## 2014-05-22 ENCOUNTER — Encounter: Payer: Self-pay | Admitting: *Deleted

## 2014-05-22 ENCOUNTER — Emergency Department (INDEPENDENT_AMBULATORY_CARE_PROVIDER_SITE_OTHER): Payer: BLUE CROSS/BLUE SHIELD

## 2014-05-22 ENCOUNTER — Telehealth: Payer: Self-pay | Admitting: *Deleted

## 2014-05-22 ENCOUNTER — Emergency Department (INDEPENDENT_AMBULATORY_CARE_PROVIDER_SITE_OTHER)
Admission: EM | Admit: 2014-05-22 | Discharge: 2014-05-22 | Disposition: A | Discharge status: Home / Self Care | Payer: BLUE CROSS/BLUE SHIELD | Source: Home / Self Care | Attending: Family Medicine | Admitting: Family Medicine

## 2014-05-22 DIAGNOSIS — S93401A Sprain of unspecified ligament of right ankle, initial encounter: Secondary | ICD-10-CM

## 2014-05-22 DIAGNOSIS — W19XXXA Unspecified fall, initial encounter: Secondary | ICD-10-CM

## 2014-05-22 DIAGNOSIS — J069 Acute upper respiratory infection, unspecified: Secondary | ICD-10-CM

## 2014-05-22 DIAGNOSIS — B9789 Other viral agents as the cause of diseases classified elsewhere: Secondary | ICD-10-CM

## 2014-05-22 DIAGNOSIS — M25571 Pain in right ankle and joints of right foot: Secondary | ICD-10-CM

## 2014-05-22 MED ORDER — AZITHROMYCIN 250 MG PO TABS: ORAL_TABLET | ORAL | Status: DC

## 2014-05-22 MED ORDER — BENZONATATE 200 MG PO CAPS: 200.0000 mg | ORAL_CAPSULE | Freq: Every day | ORAL | Status: DC

## 2014-05-22 NOTE — Telephone Encounter (Signed)
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  Needs cotesting in 1 year.

## 2014-05-22 NOTE — ED Provider Notes (Signed)
CSN: [id number]     Arrival date & time 05/22/14  2297 History   First MD Initiated Contact with Patient 05/22/14 0932     Chief Complaint  Patient presents with  . Ankle Pain      HPI Comments: Patient presents with two complaints: 1)  While walking her dog this morning, she slipped on ice, twisting her right ankle.  She has had persistent ankle pain and swelling. 2)  Patient complains of four day history of typical cold-like symptoms including mild sore throat, sinus congestion, hoarseness, headache, fatigue, and cough.  Her cough is worse at night.  The history is provided by the patient.    Past Medical History  Diagnosis Date  . GERD (gastroesophageal reflux disease)   . HPV in female    Past Surgical History  Procedure Laterality Date  . Wisdom tooth extraction    . Esophagogastroduodenoscopy endoscopy     Family History  Problem Relation Age of Onset  . Cancer Paternal Grandmother     breast  . Heart attack Maternal Grandfather    History  Substance Use Topics  . Smoking status: Never Smoker   . Smokeless tobacco: Never Used  . Alcohol Use: Yes     Comment: minimal   OB History    Gravida Para Term Preterm AB TAB SAB Ectopic Multiple Living   0 0 0 0 0 0 0 0 0 0      Review of Systems + sore throat + hoarse + cough No pleuritic pain No wheezing + nasal congestion + post-nasal drainage No sinus pain/pressure No itchy/red eyes No earache No hemoptysis No SOB No fever/chills No nausea No vomiting No abdominal pain No diarrhea No urinary symptoms No skin rash + fatigue No myalgias No headache + right ankle pain Used OTC meds without relief  Allergies  Penicillins  Home Medications   Prior to Admission medications   Medication Sig Start Date End Date Taking? Authorizing Provider  azithromycin (ZITHROMAX Z-PAK) 250 MG tablet Take 2 tabs today; then begin one tab once daily for 4 more days. (Rx void after 05/29/14) 05/22/14   Kandra Nicolas, MD   benzonatate (TESSALON) 200 MG capsule Take 1 capsule (200 mg total) by mouth at bedtime. Take as needed for cough 05/22/14   Kandra Nicolas, MD  Efinaconazole 10 % SOLN Apply 1 drop topically daily. 12/07/13   Bronson Ing, DPM  Levonorgestrel (SKYLA) 13.5 MG IUD by Intrauterine route.    Historical Provider, MD  Multiple Vitamin (MULTIVITAMIN) tablet Take 1 tablet by mouth daily.    Historical Provider, MD  Probiotic Product (PROBIOTIC DAILY PO) Take by mouth.    Historical Provider, MD   BP 111/69 mmHg  Pulse 89  Temp(Src) 98.3 F (36.8 C) (Oral)  Resp 16  Ht 5\' 4"  (1.626 m)  Wt 130 lb (58.968 kg)  BMI 22.30 kg/m2  SpO2 100%  LMP 05/04/2014 Physical Exam  Constitutional: She is oriented to person, place, and time. She appears well-developed and well-nourished. No distress.  HENT:  Head: Atraumatic.  Nose: Nose normal.  Mouth/Throat: Oropharynx is clear and moist.  Tympanic membranes are normal.  Nasal turbinates are congested but no sinus tenderness present.  Eyes: Conjunctivae and EOM are normal. Pupils are equal, round, and reactive to light. Right eye exhibits no discharge. Left eye exhibits no discharge.  Neck: Neck supple.  Posterior cervical nodes are enlarged and tender to palpation.  Cardiovascular: Normal heart sounds.   Pulmonary/Chest: Breath  sounds normal.  Abdominal: There is no tenderness.  Musculoskeletal:       Right ankle: She exhibits normal range of motion, no swelling, no ecchymosis, no deformity, no laceration and normal pulse. Tenderness. Lateral malleolus, AITFL and proximal fibula tenderness found. No medial malleolus, no CF ligament, no posterior TFL and no head of 5th metatarsal tenderness found. Achilles tendon normal.  Neurological: She is alert and oriented to person, place, and time.  Skin: Skin is warm and dry.  Nursing note and vitals reviewed.   ED Course  Procedures  none    Imaging Review Dg Ankle Complete Right  05/22/2014    CLINICAL DATA:  Initial encounter for right ankle pain after falling infarct and lung this morning.  EXAM: RIGHT ANKLE - COMPLETE 3+ VIEW  COMPARISON:  None.  FINDINGS: There is no evidence of fracture, dislocation, or joint effusion. There is no evidence of arthropathy or other focal bone abnormality. Soft tissues are unremarkable.  IMPRESSION: Negative.   Electronically Signed   By: Misty Stanley M.D.   On: 05/22/2014 09:05     MDM   1. Right ankle sprain, initial encounter   2. Fall   3. Viral URI with cough    Dispensed crutches and AirCast stirrup splint. Apply ice pack for 30 minutes every 1 to 2 hours today and tomorrow.  Elevate.  Use crutches for 3 to 5 days.  Wear Ace wrap until swelling decreases.  Wear brace for about 2 to 3 weeks.  Begin range of motion and stretching exercises in about 5 days as per instruction sheet. Continue Ibuprofen 200mg , 4 tabs every 8 hours with food.  Followup with Dr. Aundria Mems ([facility]) if not improving about two weeks.   For cold symptoms: Prescription written for Benzonatate (Tessalon) to take at bedtime for night-time cough.  Take plain guaifenesin 1200mg  (Mucinex) twice daily, with plenty of water, for cough and congestion.  May add Pseudoephedrine for sinus congestion.  Get adequate rest.   May use Afrin nasal spray (or generic oxymetazoline) twice daily for about 5 days.  Also recommend using saline nasal spray several times daily and saline nasal irrigation (AYR is a common brand) Try warm salt water gargles for sore throat.  Stop all antihistamines for now, and other non-prescription cough/cold preparations. May take Ibuprofen 200mg , 4 tabs every 8 hours with food for chest/sternum discomfort. Begin Azithromycin if not improving about one week or if persistent fever develops (Given a prescription to hold, with an expiration date)  Follow-up with family doctor if not improving about10 days.     Kandra Nicolas,  MD 05/24/14 1045

## 2014-05-22 NOTE — ED Notes (Signed)
Pt c/o RT ankle injury post fall at 0700 this morning. She reports slipping on ice while taking her  Dog to the vet. She has applied ice.

## 2014-05-22 NOTE — Telephone Encounter (Signed)
Called pt to adv need cotesting in 1 year - pt expressed understanding

## 2014-05-22 NOTE — Discharge Instructions (Signed)
Apply ice pack for 30 minutes every 1 to 2 hours today and tomorrow.  Elevate.  Use crutches for 3 to 5 days.  Wear Ace wrap until swelling decreases.  Wear brace for about 2 to 3 weeks.  Begin range of motion and stretching exercises in about 5 days as per instruction sheet. Continue Ibuprofen 200mg , 4 tabs every 8 hours with food.   For cold symptoms: Take plain guaifenesin 1200mg  (Mucinex) twice daily, with plenty of water, for cough and congestion.  May add Pseudoephedrine for sinus congestion.  Get adequate rest.   May use Afrin nasal spray (or generic oxymetazoline) twice daily for about 5 days.  Also recommend using saline nasal spray several times daily and saline nasal irrigation (AYR is a common brand) Try warm salt water gargles for sore throat.  Stop all antihistamines for now, and other non-prescription cough/cold preparations. May take Ibuprofen 200mg , 4 tabs every 8 hours with food for chest/sternum discomfort. Begin Azithromycin if not improving about one week or if persistent fever develops   Follow-up with family doctor if not improving about10 days.

## 2014-06-10 LAB — CBC WITH DIFFERENTIAL/PLATELET
Basophils Absolute: 0.1 10*3/uL (ref 0.0–0.1)
Basophils Relative: 1 % (ref 0–1)
Eosinophils Absolute: 0.2 10*3/uL (ref 0.0–0.7)
Eosinophils Relative: 2 % (ref 0–5)
HCT: 41.4 % (ref 36.0–46.0)
Hemoglobin: 13.7 g/dL (ref 12.0–15.0)
LYMPHS ABS: 1.7 10*3/uL (ref 0.7–4.0)
Lymphocytes Relative: 22 % (ref 12–46)
MCH: 28.5 pg (ref 26.0–34.0)
MCHC: 33.1 g/dL (ref 30.0–36.0)
MCV: 86.3 fL (ref 78.0–100.0)
MONOS PCT: 9 % (ref 3–12)
MPV: 11.9 fL (ref 8.6–12.4)
Monocytes Absolute: 0.7 10*3/uL (ref 0.1–1.0)
Neutro Abs: 5 10*3/uL (ref 1.7–7.7)
Neutrophils Relative %: 66 % (ref 43–77)
Platelets: 404 10*3/uL — ABNORMAL HIGH (ref 150–400)
RBC: 4.8 MIL/uL (ref 3.87–5.11)
RDW: 13.1 % (ref 11.5–15.5)
WBC: 7.5 10*3/uL (ref 4.0–10.5)

## 2014-06-10 LAB — HEPATIC FUNCTION PANEL
ALT: 16 U/L (ref 0–35)
AST: 18 U/L (ref 0–37)
Albumin: 4.1 g/dL (ref 3.5–5.2)
Alkaline Phosphatase: 60 U/L (ref 39–117)
BILIRUBIN DIRECT: 0.2 mg/dL (ref 0.0–0.3)
BILIRUBIN INDIRECT: 0.6 mg/dL (ref 0.2–1.2)
BILIRUBIN TOTAL: 0.8 mg/dL (ref 0.2–1.2)
TOTAL PROTEIN: 7.3 g/dL (ref 6.0–8.3)

## 2014-06-19 ENCOUNTER — Other Ambulatory Visit: Payer: Self-pay | Admitting: Podiatrist

## 2014-06-20 ENCOUNTER — Telehealth: Payer: Self-pay | Admitting: *Deleted

## 2014-06-20 MED ORDER — ITRACONAZOLE 200 MG PO TABS: 1.0000 | ORAL_TABLET | Freq: Every day | ORAL | Status: DC

## 2014-06-20 NOTE — Telephone Encounter (Signed)
I left patient a message to call me.  I need to inform her that Dr. Valentina Lucks said labs were okay to start taking Onmel.  Will order prescription from [facility] and they will deliver directly to your home.

## 2014-06-21 ENCOUNTER — Other Ambulatory Visit: Payer: Self-pay | Admitting: Podiatrist

## 2014-06-21 MED ORDER — ITRACONAZOLE 200 MG PO TABS: 1.0000 | ORAL_TABLET | Freq: Every day | ORAL | Status: DC

## 2014-07-19 NOTE — Telephone Encounter (Signed)
I called and left her a message that I was following up to see if she received her medication.  Call me back if you have not or have any questions or concerns.

## 2014-08-30 ENCOUNTER — Telehealth: Payer: Self-pay | Admitting: *Deleted

## 2014-08-30 DIAGNOSIS — Z79899 Other long term (current) drug therapy: Secondary | ICD-10-CM

## 2014-08-30 NOTE — Telephone Encounter (Signed)
Yes, we will call asap with the result of the lab testing-- stop taking the onmel as instructed.  Thank you.

## 2014-08-30 NOTE — Telephone Encounter (Signed)
Pt states she has taken Omnel x3 months and has noticed her fingernails have become white and the flesh beneath where the fingernails grow out has turned red.  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   I left message ordering pt to stop the Omnel, and ordered the Hepatic function and CBC and Diff blood work and that I informed DR. Egerton of pt's concern and would call again if orders changed.

## 2014-08-31 ENCOUNTER — Telehealth: Payer: Self-pay | Admitting: *Deleted

## 2014-08-31 NOTE — Telephone Encounter (Signed)
Entered in error

## 2014-08-31 NOTE — Telephone Encounter (Signed)
Lets wait and see what labs show to see if there is any other options for oral therapy-- the only other option besides oral would be topical or laser therapy which are not systemic and have a much lower cure rate.  Thanks!

## 2014-08-31 NOTE — Telephone Encounter (Signed)
i informed pt of Dr. Shaune Pollack recommendations to wait on the lab results and we would call again.

## 2014-08-31 NOTE — Telephone Encounter (Signed)
Pt called asked if she needed to pickup paper work or if it had been called to the lab.  Pt also asked what her therapy options were since she had seen some improvement after a close to 2 months of Omnel therapy.  I told pt the lab orders were in [facility] labs computer and if she needed to go elsewhere I could printup the labs to be hand carried.  I told her I would ask Dr. Valentina Lucks about optional therapies.

## 2014-09-01 LAB — CBC WITH DIFFERENTIAL/PLATELET
BASOS PCT: 1 % (ref 0–1)
Basophils Absolute: 0.1 10*3/uL (ref 0.0–0.1)
Eosinophils Absolute: 1 10*3/uL — ABNORMAL HIGH (ref 0.0–0.7)
Eosinophils Relative: 12 % — ABNORMAL HIGH (ref 0–5)
HEMATOCRIT: 42.2 % (ref 36.0–46.0)
Hemoglobin: 14.1 g/dL (ref 12.0–15.0)
LYMPHS PCT: 24 % (ref 12–46)
Lymphs Abs: 1.9 10*3/uL (ref 0.7–4.0)
MCH: 28.6 pg (ref 26.0–34.0)
MCHC: 33.4 g/dL (ref 30.0–36.0)
MCV: 85.6 fL (ref 78.0–100.0)
MONO ABS: 1 10*3/uL (ref 0.1–1.0)
MPV: 11.9 fL (ref 8.6–12.4)
Monocytes Relative: 12 % (ref 3–12)
NEUTROS ABS: 4.1 10*3/uL (ref 1.7–7.7)
Neutrophils Relative %: 51 % (ref 43–77)
Platelets: 355 10*3/uL (ref 150–400)
RBC: 4.93 MIL/uL (ref 3.87–5.11)
RDW: 13.6 % (ref 11.5–15.5)
WBC: 8.1 10*3/uL (ref 4.0–10.5)

## 2014-09-01 LAB — HEPATIC FUNCTION PANEL
ALT: 16 U/L (ref 0–35)
AST: 22 U/L (ref 0–37)
Albumin: 4.3 g/dL (ref 3.5–5.2)
Alkaline Phosphatase: 67 U/L (ref 39–117)
Bilirubin, Direct: 0.2 mg/dL (ref 0.0–0.3)
Indirect Bilirubin: 0.6 mg/dL (ref 0.2–1.2)
TOTAL PROTEIN: 7.5 g/dL (ref 6.0–8.3)
Total Bilirubin: 0.8 mg/dL (ref 0.2–1.2)

## 2014-09-06 ENCOUNTER — Telehealth: Payer: Self-pay | Admitting: *Deleted

## 2014-09-06 NOTE — Telephone Encounter (Signed)
Fine to print out and she can pick up or we can mail them.  Maybe I can even release them so she can print them out at home?  Instructions are to just follow up with a primary care doc regarding the increased levels of eosinophils and stop taking that antifungal by mouth.  If she wants, she can do the topical jublia or kerydin as well.

## 2014-09-06 NOTE — Telephone Encounter (Addendum)
Pt request blood work results and instructions.  Forwarded request to Dr. Valentina Lucks.  Left message with pt's voicemail with Dr. Shaune Pollack recommendations and mailed lab results to pt's home address.

## 2014-09-15 ENCOUNTER — Ambulatory Visit (INDEPENDENT_AMBULATORY_CARE_PROVIDER_SITE_OTHER): Payer: BLUE CROSS/BLUE SHIELD | Admitting: Family Medicine

## 2014-09-15 ENCOUNTER — Encounter: Payer: Self-pay | Admitting: Family Medicine

## 2014-09-15 VITALS — BP 119/72 | HR 80 | Wt 141.0 lb

## 2014-09-15 DIAGNOSIS — R5383 Other fatigue: Secondary | ICD-10-CM

## 2014-09-15 DIAGNOSIS — D721 Eosinophilia, unspecified: Secondary | ICD-10-CM

## 2014-09-15 DIAGNOSIS — E559 Vitamin D deficiency, unspecified: Secondary | ICD-10-CM

## 2014-09-15 LAB — CBC WITH DIFFERENTIAL/PLATELET
Basophils Absolute: 0.1 10*3/uL (ref 0.0–0.1)
Basophils Relative: 1 % (ref 0–1)
Eosinophils Absolute: 0.7 10*3/uL (ref 0.0–0.7)
Eosinophils Relative: 7 % — ABNORMAL HIGH (ref 0–5)
HEMATOCRIT: 44 % (ref 36.0–46.0)
Hemoglobin: 14.8 g/dL (ref 12.0–15.0)
LYMPHS PCT: 22 % (ref 12–46)
Lymphs Abs: 2 10*3/uL (ref 0.7–4.0)
MCH: 28.4 pg (ref 26.0–34.0)
MCHC: 33.6 g/dL (ref 30.0–36.0)
MCV: 84.5 fL (ref 78.0–100.0)
MONOS PCT: 10 % (ref 3–12)
MPV: 12.5 fL — ABNORMAL HIGH (ref 8.6–12.4)
Monocytes Absolute: 0.9 10*3/uL (ref 0.1–1.0)
NEUTROS ABS: 5.6 10*3/uL (ref 1.7–7.7)
Neutrophils Relative %: 60 % (ref 43–77)
Platelets: 366 10*3/uL (ref 150–400)
RBC: 5.21 MIL/uL — ABNORMAL HIGH (ref 3.87–5.11)
RDW: 13 % (ref 11.5–15.5)
WBC: 9.3 10*3/uL (ref 4.0–10.5)

## 2014-09-15 LAB — VITAMIN B12: VITAMIN B 12: 772 pg/mL (ref 211–911)

## 2014-09-15 NOTE — Progress Notes (Addendum)
CC: Barbara Bauer is a 39 y.o. female is here for f/u labs   Subjective: HPI:  2 weeks ago was found to have a mild eosinophilia when her podiatrist did routine blood work. She tells me she was in her regular state of health that day and up until today. She denies any new known allergies or suffering from any allergies at the time of this blood work. She denies nasal congestion, cough, wheezing, shortness of breath, flushing or skin reaction. She denies any known exposure to parasites and does have occasional rectal itching but no constipation or diarrhea.Denies unintentional weight loss.  Complains of chronic fatigue and "blah" feeling this and present for a number of years now. Multiple blood tests have been unremarkable other than what she thinks may have been a vitamin D deficiency in the past but she's currently not taking any vitamin D supplementation. She's had her thyroid function checked which was normal in the past. She denies weakness shortness of breath or history of anemia.   Review Of Systems Outlined In HPI  Past Medical History  Diagnosis Date  . GERD (gastroesophageal reflux disease)   . HPV in female     Past Surgical History  Procedure Laterality Date  . Wisdom tooth extraction    . Esophagogastroduodenoscopy endoscopy     Family History  Problem Relation Age of Onset  . Cancer Paternal Grandmother     breast  . Heart attack Maternal Grandfather     History   Social History  . Marital Status: Single    Spouse Name: N/A  . Number of Children: N/A  . Years of Education: N/A   Occupational History  . professor    Social History Main Topics  . Smoking status: Never Smoker   . Smokeless tobacco: Never Used  . Alcohol Use: Yes     Comment: minimal  . Drug Use: No  . Sexual Activity:    Partners: Male    Birth Control/ Protection: IUD   Other Topics Concern  . Not on file   Social History Narrative     Objective: BP 119/72 mmHg  Pulse 80  Wt  141 lb (63.957 kg)  Vital signs reviewed. General: Alert and Oriented, No Acute Distress HEENT: Pupils equal, round, reactive to light. Conjunctivae clear.  External ears unremarkable.  Moist mucous membranes. Lungs: Clear and comfortable work of breathing, speaking in full sentences without accessory muscle use. Cardiac: Regular rate and rhythm.  Neuro: CN II-XII grossly intact, gait normal. Extremities: No peripheral edema.  Strong peripheral pulses.  Mental Status: No depression, anxiety, nor agitation. Logical though process. Skin: Warm and dry.  Assessment & Plan: Paiten was seen today for f/u labs.  Diagnoses and all orders for this visit:  Eosinophilia Orders: -     CBC w/Diff  Vitamin D deficiency Orders: -     Vit D  25 hydroxy (rtn osteoporosis monitoring)  Other fatigue Orders: -     Vitamin B12   Eosinophilia: Discussed that this could've just been a random outlier especially she was mounting a immune response to seasonal allergies that were not causing her any physical symptoms. Additionally this could be from signs of parasite. We'll start with rechecking CBC and differential and if persistent eosinophilia will pursue with stool studies. Fatigue with history of vitamin D deficiency: It be wise to recheck vitamin D level since this could be causing some of her physicalcomplaints today. Rule out B12 deficiency  Return if symptoms worsen or  fail to improve.

## 2014-09-15 NOTE — Addendum Note (Signed)
Addended by: Isaias Cowman C on: 09/15/2014 11:04 AM   Modules accepted: Orders, Medications

## 2014-09-15 NOTE — Addendum Note (Signed)
Addended by: Marcial Pacas on: 09/15/2014 10:44 AM   Modules accepted: Level of Service

## 2014-09-15 NOTE — Addendum Note (Signed)
Addended by: Marcial Pacas on: 09/15/2014 11:30 AM   Modules accepted: Orders, Level of Service

## 2014-09-16 LAB — VITAMIN D 25 HYDROXY (VIT D DEFICIENCY, FRACTURES): Vit D, 25-Hydroxy: 38 ng/mL (ref 30–100)

## 2014-09-18 ENCOUNTER — Telehealth: Payer: Self-pay | Admitting: Family Medicine

## 2014-09-18 DIAGNOSIS — D721 Eosinophilia, unspecified: Secondary | ICD-10-CM | POA: Insufficient documentation

## 2014-09-18 NOTE — Telephone Encounter (Signed)
Pt.notified

## 2014-09-18 NOTE — Telephone Encounter (Signed)
Seth Bake, Will you please let patient know that her Vitamin D and B12 levels are normal.  Her eosinophils have decreased closer to the normal range however are still barely elevated therefore I'd recommend having a parasite examination of a stool sample.  Lab order in your inbox.

## 2015-01-10 ENCOUNTER — Encounter: Payer: Self-pay | Admitting: Physician Assistant

## 2015-01-10 ENCOUNTER — Ambulatory Visit (INDEPENDENT_AMBULATORY_CARE_PROVIDER_SITE_OTHER): Payer: BLUE CROSS/BLUE SHIELD | Admitting: Physician Assistant

## 2015-01-10 VITALS — BP 113/65 | HR 85 | Ht 64.0 in | Wt 143.0 lb

## 2015-01-10 DIAGNOSIS — Z0000 Encounter for general adult medical examination without abnormal findings: Secondary | ICD-10-CM

## 2015-01-10 DIAGNOSIS — E049 Nontoxic goiter, unspecified: Secondary | ICD-10-CM

## 2015-01-10 DIAGNOSIS — K2100 Gastro-esophageal reflux disease with esophagitis, without bleeding: Secondary | ICD-10-CM

## 2015-01-10 DIAGNOSIS — D721 Eosinophilia, unspecified: Secondary | ICD-10-CM

## 2015-01-10 DIAGNOSIS — R1310 Dysphagia, unspecified: Secondary | ICD-10-CM

## 2015-01-10 LAB — COMPLETE METABOLIC PANEL WITH GFR
ALBUMIN: 4.4 g/dL (ref 3.6–5.1)
ALK PHOS: 70 U/L (ref 33–115)
ALT: 14 U/L (ref 6–29)
AST: 21 U/L (ref 10–30)
BUN: 13 mg/dL (ref 7–25)
CALCIUM: 9.8 mg/dL (ref 8.6–10.2)
CO2: 25 mmol/L (ref 20–31)
CREATININE: 0.85 mg/dL (ref 0.50–1.10)
Chloride: 102 mmol/L (ref 98–110)
GFR, Est African American: >89 mL/min (ref >=60)
GFR, Est Non African American: 87 mL/min (ref >=60)
Glucose, Bld: 88 mg/dL (ref 65–99)
POTASSIUM: 4.3 mmol/L (ref 3.5–5.3)
Sodium: 137 mmol/L (ref 135–146)
Total Bilirubin: 0.8 mg/dL (ref 0.2–1.2)
Total Protein: 7.5 g/dL (ref 6.1–8.1)

## 2015-01-10 LAB — CBC WITH DIFFERENTIAL/PLATELET
BASOS PCT: 1 % (ref 0–1)
Basophils Absolute: 0.1 10*3/uL (ref 0.0–0.1)
EOS ABS: 0.1 10*3/uL (ref 0.0–0.7)
Eosinophils Relative: 2 % (ref 0–5)
HCT: 41.2 % (ref 36.0–46.0)
Hemoglobin: 14 g/dL (ref 12.0–15.0)
Lymphocytes Relative: 19 % (ref 12–46)
Lymphs Abs: 1.4 10*3/uL (ref 0.7–4.0)
MCH: 29.2 pg (ref 26.0–34.0)
MCHC: 34 g/dL (ref 30.0–36.0)
MCV: 85.8 fL (ref 78.0–100.0)
MPV: 12.2 fL (ref 8.6–12.4)
Monocytes Absolute: 0.6 10*3/uL (ref 0.1–1.0)
Monocytes Relative: 8 % (ref 3–12)
Neutro Abs: 5.1 10*3/uL (ref 1.7–7.7)
Neutrophils Relative %: 70 % (ref 43–77)
Platelets: 394 10*3/uL (ref 150–400)
RBC: 4.8 MIL/uL (ref 3.87–5.11)
RDW: 12.7 % (ref 11.5–15.5)
WBC: 7.3 10*3/uL (ref 4.0–10.5)

## 2015-01-10 LAB — LIPID PANEL
CHOL/HDL RATIO: 3.1 ratio (ref <=5.0)
CHOLESTEROL: 193 mg/dL (ref 125–200)
HDL: 63 mg/dL (ref >=46)
LDL Cholesterol: 120 mg/dL (ref <130)
TRIGLYCERIDES: 52 mg/dL (ref <150)
VLDL: 10 mg/dL (ref <30)

## 2015-01-10 MED ORDER — RANITIDINE HCL 150 MG PO CAPS: 150.0000 mg | ORAL_CAPSULE | Freq: Two times a day (BID) | ORAL | Status: DC

## 2015-01-10 NOTE — Addendum Note (Signed)
Addended byIran Planas L on: 01/10/2015 12:00 PM   Modules accepted: Orders

## 2015-01-10 NOTE — Patient Instructions (Signed)
GI referral.  Start zantac twice a day.  Gluten free diet for 2-3 weeks.  TSH/CBC ordered.  Will get u/s of thyroid.

## 2015-01-10 NOTE — Progress Notes (Addendum)
   Subjective:    Patient ID: Barbara Bauer, female    DOB: 1976-03-06, 39 y.o.   MRN: [id number]  HPI  Patient presents to clinic with concerns of "GI issues" and a report of eosinophilia from her podiatrist.   She has been experiencing frequent episodes of dysphagia with solids, but has not experienced symptoms with liquids. Immediately after eating she feels "tightness in her throat" and sometimes will immediately regurgitate the food. She has a history of GERD and has experienced reflux symptoms after eating as well. She states symptoms do not correlate with any particular food, but was told by integrative health that she has many food sensitivities. She denies any abdominal pain or discomfort. She also denies N/V/D, constipation, or changes in bowel habits. She has experienced alternating constipation/diarrhea throughout her life but describes current symptoms as different from previous GI disturbances.She has concerns regarding gaining approximately 20 lbs over the past year and a half. She states additional weight is concentrated in her abdomen, arms, and face  She states that her eosinophils were elevated on routine blood work drawn in May to monitor terbinafine treatment.  . She has a history of thyroid goiter and states she feels her energy levels are lower than normal.   She has a family history of autoimmune disease, her father has been recently diagnosed with pityriasis rubra pilaris. She denies any skin changes or joint pain lasting longer than 30 minutes.    .. Past Medical History  Diagnosis Date  . GERD (gastroesophageal reflux disease)   . HPV in female      Review of Systems Positive for symptoms listed in the HPI    Objective:   Physical Exam  ..Physical Exam   Blood pressure 113/65, pulse 85, height 5\' 4"  (1.626 m), weight 143 lb (64.864 kg).  Constitutional: Well developed, well nourished adult not under acute distress Neck: No nodes palpated. Mild thyromegaly  palpated in right lobe. Thyroid is smooth and nontender Cardiac: Normal S1, S2 auscultated. No M/G/R Pulm: CTAB GI: Abdomen is not distended and non tender to palpation in all 4 quadrants. +BS. Negative Murphey sign      Assessment & Plan:  Dysphagia/GERD- Refer to GI for further evaluation and possible endoscopy. Consider rheumatology referral if GI evaluation negative. Rx zantac bid for relief of GERD symptoms. Discussed PPI but decided on zantac first. Encourage 2 week gluten elimination diet to see if symptoms improve.   Thyromegaly/hx the of thyroid goiter- Order TSH and ultrasound of neck to evaluate thyroid function and size.  Suspected eosinophilia - CBC with differential to evaluate if eosinophilia has resolved.  Complete Physical Exam- Order CMP and lipid panel. Pt advised to scheduled in December.    Reviewed by Iran Planas PA-C

## 2015-01-10 NOTE — Addendum Note (Signed)
Addended by: Donella Stade on: 01/10/2015 11:58 AM   Modules accepted: Orders

## 2015-01-11 LAB — TSH: TSH: 0.943 u[IU]/mL (ref 0.350–4.500)

## 2015-01-26 ENCOUNTER — Other Ambulatory Visit: Payer: BLUE CROSS/BLUE SHIELD

## 2015-02-22 ENCOUNTER — Encounter: Payer: Self-pay | Admitting: Internal Medicine

## 2015-02-22 ENCOUNTER — Other Ambulatory Visit (INDEPENDENT_AMBULATORY_CARE_PROVIDER_SITE_OTHER): Payer: BLUE CROSS/BLUE SHIELD

## 2015-02-22 ENCOUNTER — Ambulatory Visit (INDEPENDENT_AMBULATORY_CARE_PROVIDER_SITE_OTHER): Payer: BLUE CROSS/BLUE SHIELD | Admitting: Internal Medicine

## 2015-02-22 VITALS — BP 90/56 | HR 72 | Ht 64.0 in | Wt 148.8 lb

## 2015-02-22 DIAGNOSIS — R194 Change in bowel habit: Secondary | ICD-10-CM

## 2015-02-22 DIAGNOSIS — R140 Abdominal distension (gaseous): Secondary | ICD-10-CM | POA: Diagnosis not present

## 2015-02-22 DIAGNOSIS — R1310 Dysphagia, unspecified: Secondary | ICD-10-CM

## 2015-02-22 DIAGNOSIS — K219 Gastro-esophageal reflux disease without esophagitis: Secondary | ICD-10-CM

## 2015-02-22 LAB — IGA: IgA: 244 mg/dL (ref 68–378)

## 2015-02-22 NOTE — Patient Instructions (Signed)

## 2015-02-22 NOTE — Progress Notes (Signed)
HISTORY OF PRESENT ILLNESS:  Barbara Bauer is a 39 y.o. female with no significant past medical history who was referred today by her primary care provider, Iran Planas PA-C, with multiple GI chief complaints including GERD, dysphagia, abdominal bloating, and alternating bowel habits. First, the patient reports problems with intermittent solid food dysphagia since the eighth grade. She describes items such as meat, bread, and apples transiently lodging in the center of the chest with associated chest pain. Fluids will regurgitate. Relief achieved with vomiting. She initially had this evaluated in [place]. Apparently underwent upper endoscopy and was told she had erosions on the esophagus for which she was placed on Nexium 40 mg daily. She continued on the medication over the next 4 years and for the most part, described no problems. Her medication was discontinued at the recommendation of her PCP with concerns over potential side effects from long-term PPI use. The patient was weaned off the medication. Symptoms have recurred over the past 6 months. Ranitidine was recommended. Taking 150 mg daily. Had problems as recently as yesterday. She does have occasional classic reflux symptoms, though infrequently. She has had 18 pound weight gain over the past year. Next, she reports chronic abdominal bloating. Distended abdomen particularly after meals. This has been worse with associated weight gain. No additional exacerbating factors. Possibly relieved with good bowel movements. She has been taking daily probiotic with questionable effect. Finally, she reports alternating bowel habits. She does not use laxatives or antidiarrheals. Symptoms seem to be associated with stress or anxiety. No rectal bleeding. No family history of inflammatory bowel disease or gastrointestinal malignancies. Review of outside laboratories from 01/10/2015 reveal normal comprehensive metabolic panel, normal CBC, and normal TSH.  REVIEW  OF SYSTEMS:  All non-GI ROS negative upon comprehensive review  Past Medical History  Diagnosis Date  . GERD (gastroesophageal reflux disease)   . HPV in female   . Thyroid goiter   . Eosinophilia     Past Surgical History  Procedure Laterality Date  . Wisdom tooth extraction    . Esophagogastroduodenoscopy endoscopy      Social History Barbara Bauer  reports that she has never smoked. She has never used smokeless tobacco. She reports that she drinks alcohol. She reports that she does not use illicit drugs.  family history includes Breast cancer in her maternal grandmother; Heart attack in her maternal grandfather; Other in her father. There is no history of Colon cancer, Esophageal cancer, Stomach cancer, Pancreatic cancer, Colon polyps, Diabetes, Kidney disease, or Liver disease.  Allergies  Allergen Reactions  . Penicillins Rash       PHYSICAL EXAMINATION: Vital signs: BP 90/56 mmHg  Pulse 72  Ht 5\' 4"  (1.626 m)  Wt 148 lb 12.8 oz (67.495 kg)  BMI 25.53 kg/m2  Constitutional: generally well-appearing, no acute distress Psychiatric: alert and oriented x3, cooperative Eyes: extraocular movements intact, anicteric, conjunctiva pink Mouth: oral pharynx moist, no lesions. Nose: Nose ring left nostril Neck: supple without thyromegaly Lymph: no supraclavicular lymphadenopathy Cardiovascular: heart regular rate and rhythm, no murmur Lungs: clear to auscultation bilaterally Abdomen: soft, nontender, nondistended, no obvious ascites, no peritoneal signs, normal bowel sounds, no organomegaly Rectal: Ommitted Extremities: no clubbing cyanosis or lower extremity edema bilaterally Skin: no lesions on visible extremities Neuro: No focal deficits. Normal DTRs.  ASSESSMENT:  #1. Long-standing intermittent solid food dysphagia. Suspect peptic stricture related to GERD or esophageal eosinophilia (primary or secondary to GERD). Suspect acid related disease based on prior history  of responding  to Nexium. #2. Chronic abdominal bloating without alarm features. Rule out celiac disease #3. Chronic alternating bowel habits. Rule out celiac disease. Rule out IBS #4. Questions regarding chronic PPI use need addressed  PLAN:  #1. Reflux precautions #2. Schedule upper endoscopy with esophageal biopsies and possible esophageal dilation.The nature of the procedure, as well as the risks, benefits, and alternatives were carefully and thoroughly reviewed with the patient. Ample time for discussion and questions allowed. The patient understood, was satisfied, and agreed to proceed. #3. Check tissue transglutaminase antibody and serum IgA level to screen for celiac disease #4. Long discussion today regarding chronic PPI use and purported potential long-term side effects. I reviewed with her in detail concerns at been raised and the current evidence for or against such. I suspect this patient will benefit from regular PPI use  A copy of this consultation note has been sent to [facility] PA-C

## 2015-02-26 LAB — TISSUE TRANSGLUTAMINASE, IGA: Tissue Transglutaminase Ab, IgA: 1 U/mL (ref <4)

## 2015-04-12 ENCOUNTER — Ambulatory Visit (AMBULATORY_SURGERY_CENTER): Payer: BLUE CROSS/BLUE SHIELD | Admitting: Internal Medicine

## 2015-04-12 ENCOUNTER — Encounter: Payer: Self-pay | Admitting: Internal Medicine

## 2015-04-12 VITALS — BP 101/67 | HR 75 | Temp 98.3°F | Resp 13 | Ht 64.0 in | Wt 148.0 lb

## 2015-04-12 DIAGNOSIS — K2100 Gastro-esophageal reflux disease with esophagitis, without bleeding: Secondary | ICD-10-CM

## 2015-04-12 DIAGNOSIS — R1310 Dysphagia, unspecified: Secondary | ICD-10-CM

## 2015-04-12 DIAGNOSIS — K222 Esophageal obstruction: Secondary | ICD-10-CM

## 2015-04-12 DIAGNOSIS — K219 Gastro-esophageal reflux disease without esophagitis: Secondary | ICD-10-CM

## 2015-04-12 MED ORDER — OMEPRAZOLE 40 MG PO CPDR: 40.0000 mg | DELAYED_RELEASE_CAPSULE | Freq: Every day | ORAL | Status: DC

## 2015-04-12 MED ORDER — SODIUM CHLORIDE 0.9 % IV SOLN: 500.0000 mL | INTRAVENOUS | Status: DC

## 2015-04-12 NOTE — Progress Notes (Signed)
Report to PACU, RN, vss, BBS= Clear.  

## 2015-04-12 NOTE — Progress Notes (Signed)
No egg or soy allergy known to patient  No issues with past sedation with any surgeries  or procedures, no intubation problems  No diet pills No home 02 use per patient

## 2015-04-12 NOTE — Progress Notes (Signed)
Called to room to assist during endoscopic procedure.  Patient ID and intended procedure confirmed with present staff. Received instructions for my participation in the procedure from the performing physician.  

## 2015-04-12 NOTE — Op Note (Signed)
[facility]  [address] [place], [address]   ENDOSCOPY PROCEDURE REPORT  PATIENT: Barbara Bauer, Barbara Bauer  MR#: [id number] BIRTHDATE: [birth year] , 39  yrs. old GENDER: female ENDOSCOPIST: Eustace Quail, MD REFERRED BY:  Iran Planas, PA-C. PROCEDURE DATE:  04/12/2015 PROCEDURE:  EGD, diagnostic and Maloney dilation of esophagus   - 55f ASA CLASS:     Class I INDICATIONS:  dysphagia. Intermittent solid food with transient food impactions. MEDICATIONS: Monitored anesthesia care and Propofol 300 mg IV TOPICAL ANESTHETIC: none  DESCRIPTION OF PROCEDURE: After the risks benefits and alternatives of the procedure were thoroughly explained, informed consent was obtained.  The LB [id number] endoscope was introduced through the mouth and advanced to the second portion of the duodenum , Without limitations.  The instrument was slowly withdrawn as the mucosa was fully examined.  EXAM:Esophagus revealed a ringlike stricture at the gastroesophageal junction (39 cm from the incisors) with esophagitis manifested by mucosal edema without erosions.  No Barrett's esophagus.  Stomach was normal.  Duodenum was normal.  Retroflexed views revealed no abnormalities.     The scope was then withdrawn from the patient and the procedure completed. THERAPY: 54 French Maloney dilator was passed into the esophagus without resistance. Trace heme upon removal. Relook endoscopy showed disruption of distal esophageal stricture without complication. Tolerated well  COMPLICATIONS: There were no immediate complications.  ENDOSCOPIC IMPRESSION: 1. 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 Otherwise normal EGD  RECOMMENDATIONS: 1.  Clear liquids until 4 PM, then soft foods rest of day.  Resume prior diet tomorrow. 2.  Discontinue Zantac (ranitidine) 3.  Initiate omeprazole 40 mg daily; #30; 11 refills 4.  Call office next 2-3 days to schedule an office appointment  with Dr. Henrene Pastor in about 2 months.  REPEAT EXAM:  eSigned:  Eustace Quail, MD 04/12/2015 1:18 PM    CC:The Patient  ; Iran Planas, PA-C

## 2015-04-12 NOTE — Patient Instructions (Signed)
Impressions/recommendations:  GERD (handout given) Stricture (handout given) Post- dilation diet (handout given)  Discontinue Zantac. Start omeprazole 40 mg po.  Call office in next 2-3 days to schedule an appointment for in 2 months.  YOU HAD AN ENDOSCOPIC PROCEDURE TODAY AT [facility]:   Refer to the procedure report that was given to you for any specific questions about what was found during the examination.  If the procedure report does not answer your questions, please call your gastroenterologist to clarify.  If you requested that your care partner not be given the details of your procedure findings, then the procedure report has been included in a sealed envelope for you to review at your convenience later.  YOU SHOULD EXPECT: Some feelings of bloating in the abdomen. Passage of more gas than usual.  Walking can help get rid of the air that was put into your GI tract during the procedure and reduce the bloating. If you had a lower endoscopy (such as a colonoscopy or flexible sigmoidoscopy) you may notice spotting of blood in your stool or on the toilet paper. If you underwent a bowel prep for your procedure, you may not have a normal bowel movement for a few days.  Please Note:  You might notice some irritation and congestion in your nose or some drainage.  This is from the oxygen used during your procedure.  There is no need for concern and it should clear up in a day or so.  SYMPTOMS TO REPORT IMMEDIATELY:   Following upper endoscopy (EGD)  Vomiting of blood or coffee ground material  New chest pain or pain under the shoulder blades  Painful or persistently difficult swallowing  New shortness of breath  Fever of 100F or higher  Black, tarry-looking stools  For urgent or emergent issues, a gastroenterologist can be reached at any hour by calling [phone].   DIET: Your first meal following the procedure should be a small meal and then it is ok to progress  to your normal diet. Heavy or fried foods are harder to digest and may make you feel nauseous or bloated.  Likewise, meals heavy in dairy and vegetables can increase bloating.  Drink plenty of fluids but you should avoid alcoholic beverages for 24 hours.  ACTIVITY:  You should plan to take it easy for the rest of today and you should NOT DRIVE or use heavy machinery until tomorrow (because of the sedation medicines used during the test).    FOLLOW UP: Our staff will call the number listed on your records the next business day following your procedure to check on you and address any questions or concerns that you may have regarding the information given to you following your procedure. If we do not reach you, we will leave a message.  However, if you are feeling well and you are not experiencing any problems, there is no need to return our call.  We will assume that you have returned to your regular daily activities without incident.  If any biopsies were taken you will be contacted by phone or by letter within the next 1-3 weeks.  Please call us at [phone] if you have not heard about the biopsies in 3 weeks.    SIGNATURES/CONFIDENTIALITY: You and/or your care partner have signed paperwork which will be entered into your electronic medical record.  These signatures attest to the fact that that the information above on your After Visit Summary has been reviewed and is understood.  Full responsibility of the confidentiality of this discharge information lies with you and/or your care-partner.

## 2015-04-13 ENCOUNTER — Telehealth: Payer: Self-pay | Admitting: *Deleted

## 2015-04-13 NOTE — Telephone Encounter (Signed)
  Follow up Call-  Call back number 04/12/2015  Post procedure Call Back phone  # [phone]  Permission to leave phone message Yes     Patient questions:  Do you have a fever, pain , or abdominal swelling? No. Pain Score  0 *  Have you tolerated food without any problems? Yes.    Have you been able to return to your normal activities? Yes.    Do you have any questions about your discharge instructions: Diet   No. Medications  No. Follow up visit  No.  Do you have questions or concerns about your Care? No.  Actions: * If pain score is 4 or above: No action needed, pain <4.

## 2015-06-15 ENCOUNTER — Ambulatory Visit: Payer: BLUE CROSS/BLUE SHIELD | Admitting: Internal Medicine

## 2015-07-30 ENCOUNTER — Ambulatory Visit (INDEPENDENT_AMBULATORY_CARE_PROVIDER_SITE_OTHER): Payer: BLUE CROSS/BLUE SHIELD | Admitting: Obstetrics & Gynecology

## 2015-07-30 ENCOUNTER — Encounter: Payer: Self-pay | Admitting: Obstetrics & Gynecology

## 2015-07-30 VITALS — BP 123/82 | HR 78 | Resp 16 | Ht 64.0 in | Wt 154.0 lb

## 2015-07-30 DIAGNOSIS — Z30432 Encounter for removal of intrauterine contraceptive device: Secondary | ICD-10-CM | POA: Diagnosis not present

## 2015-07-30 DIAGNOSIS — Z1151 Encounter for screening for human papillomavirus (HPV): Secondary | ICD-10-CM

## 2015-07-30 DIAGNOSIS — Z01419 Encounter for gynecological examination (general) (routine) without abnormal findings: Secondary | ICD-10-CM | POA: Diagnosis not present

## 2015-07-30 DIAGNOSIS — Z124 Encounter for screening for malignant neoplasm of cervix: Secondary | ICD-10-CM

## 2015-07-30 DIAGNOSIS — A6000 Herpesviral infection of urogenital system, unspecified: Secondary | ICD-10-CM

## 2015-07-30 MED ORDER — VALACYCLOVIR HCL 500 MG PO TABS: 500.0000 mg | ORAL_TABLET | Freq: Two times a day (BID) | ORAL | Status: DC

## 2015-07-30 NOTE — Progress Notes (Addendum)
  Subjective:     Barbara Bauer is a 40 y.o. female here for a routine exam.  Current complaints: weight gain, longer menses (10 days) and shoreter cycles on Skyla.  Pt also thinks she has HSV form new partner.  Pt has one lesion on left labia.  4 outbreaks in past 2 years.  Pt also getting cold sores on mouth.  Interested in Valtrex for outbreaks.  Not interested in suppression.  Pt wants to wait until next year's visit to order her mammogram (starting at 7)..   Gynecologic History Patient's last menstrual period was 07/18/2015. Contraception: IUD--plans to use condoms and vasectomy Last Pap: 2016. 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  Review of Systems Pertinent items noted in HPI and remainder of comprehensive ROS otherwise negative.    Objective:      Filed Vitals:   07/30/15 1542  BP: 123/82  Pulse: 78  Resp: 16  Height: 5\' 4"  (1.626 m)  Weight: 154 lb (69.854 kg)   Vitals:  WNL General appearance: alert, cooperative and no distress  HEENT: Normocephalic, without obvious abnormality, atraumatic Eyes: negative Throat: lips, mucosa, and tongue normal; teeth and gums normal  Respiratory: Clear to auscultation bilaterally  CV: Regular rate and rhythm  Breasts:  Normal appearance, no masses or tenderness, no nipple retraction or dimpling  GI: Soft, non-tender; bowel sounds normal; no masses,  no organomegaly  GU: External Genitalia:  Tanner V, probably HSV lesion on left labia majora, starting to crust over Urethra:  No prolapse   Vagina: Pink, normal rugae, no blood or discharge  Cervix: No CMT, no  lesion, IUD strings seen  Uterus:  Normal size and contour, non tender  Adnexa: Normal, no masses, non tender  Musculoskeletal: No edema, redness or tenderness in the calves or thighs  Skin: No lesions or rash  Lymphatic: Axillary adenopathy: none    Psychiatric: Normal mood and behavior          Assessment:    Healthy female exam.   HSV Desires IUD removal--believes it is causing weight gain and menstrual irregularities.     Plan:    Contraception: condoms and vasectomy. Follow up in: 1 year. pap with costesting  Valtrex for outbreaks Mammogram to be ordered at next years visit per patient request Plan B discussed if condom breaks.      IUD Removal  Preoperative Diagnosis:  Keshonda Leapley is a 40 y.o. G0P0000 with  IUD  Postoperative Diagnosis:  Wealthy Charlemagne is a 40 y.o. G0P0000 with  IUD  Procedure:  Removal or IUD  Surgeon:  Dr. Silas [place]  Anestheisa:  None  Complications:  None  Pathology:  None  Procedure:   The patient was placed in dorsal lithotomy position.  A time out was performed.  Speculum placed in the vagina and the cervix visualized.  The IUD strings were seen and grasped and the IUD was removed without difficulty.  There was minimal bleeding.  Patient tolerated the procedure well.  All counts were correct x2.

## 2015-08-02 LAB — CYTOLOGY - PAP

## 2015-08-03 ENCOUNTER — Telehealth: Payer: Self-pay

## 2015-08-03 NOTE — Telephone Encounter (Signed)
-----   Message from Guss Bunde, MD sent at 08/03/2015  5:57 AM EDT ----- nml pap and HPV negative

## 2015-09-05 ENCOUNTER — Encounter: Payer: Self-pay | Admitting: Physician Assistant

## 2015-09-05 ENCOUNTER — Ambulatory Visit (INDEPENDENT_AMBULATORY_CARE_PROVIDER_SITE_OTHER): Payer: BLUE CROSS/BLUE SHIELD | Admitting: Physician Assistant

## 2015-09-05 VITALS — BP 106/70 | HR 85 | Ht 64.0 in | Wt 149.0 lb

## 2015-09-05 DIAGNOSIS — R61 Generalized hyperhidrosis: Secondary | ICD-10-CM

## 2015-09-05 DIAGNOSIS — R600 Localized edema: Secondary | ICD-10-CM | POA: Diagnosis not present

## 2015-09-05 DIAGNOSIS — R635 Abnormal weight gain: Secondary | ICD-10-CM | POA: Diagnosis not present

## 2015-09-05 MED ORDER — HYDROCHLOROTHIAZIDE 12.5 MG PO TABS: ORAL_TABLET | ORAL | Status: DC

## 2015-09-05 MED ORDER — PHENTERMINE HCL 37.5 MG PO TABS: 37.5000 mg | ORAL_TABLET | Freq: Every day | ORAL | Status: DC

## 2015-09-05 NOTE — Progress Notes (Signed)
   Subjective:    Patient ID: Barbara Bauer, female    DOB: 1976-03-25, 40 y.o.   MRN: [id number]  HPI  Pt presents to the clinic with bilateral leg swelling and heaviness. She got skyla IUD removed early April she noticed symptoms just after and for the last 3 weeks. Hx of varicose veins but had procedure to laser them. She noticed some swelling in her whole leg and ankles. Heaviness was the most bothersome. She has not had any symptoms in the last week. She has had overall weight gain over the last year. She is down 5lbs since 4/3 and IUD removal. She is hoping to lose more.    Review of Systems  All other systems reviewed and are negative.      Objective:   Physical Exam  Constitutional: She is oriented to person, place, and time. She appears well-developed and well-nourished.  HENT:  Head: Normocephalic and atraumatic.  Cardiovascular: Normal rate, regular rhythm and normal heart sounds.   Pulmonary/Chest: Effort normal and breath sounds normal.  Neurological: She is alert and oriented to person, place, and time.  Skin:  No swelling in extremities today.   Psychiatric: She has a normal mood and affect. Her behavior is normal.          Assessment & Plan:  Bilateral lower extremity edema- HCTZ as needed to take up to 2 tablets. Discussed conservative management with low sodium diet, compression and foot elevation. pershaps some of swelling from IUD removal.   Abnormal weight gain- hopefully coming off OCP will help with weight. Discussed medication options. Only a candidate for phentermine due to BMI. Discussed side effects. Encouraged to start at 1/2 tablet. Follow up in 1 month.

## 2015-09-06 ENCOUNTER — Telehealth: Payer: Self-pay | Admitting: *Deleted

## 2015-09-06 ENCOUNTER — Encounter: Payer: Self-pay | Admitting: Physician Assistant

## 2015-09-06 NOTE — Telephone Encounter (Signed)
[facility] called and states the HCTZ tablets were on back order and wanted to change it to capsules. I told them it was ok as long as it was ok with the patient.Called the patient and left a vm letting her know that tablets for that medication are on back order and they will probably fill capsules for her. Ask that she let us know if she has any concerns or problems with this medication being in capsule form

## 2016-06-03 ENCOUNTER — Other Ambulatory Visit: Payer: Self-pay | Admitting: Physician Assistant

## 2016-06-22 ENCOUNTER — Other Ambulatory Visit: Payer: Self-pay | Admitting: Internal Medicine

## 2016-06-22 DIAGNOSIS — R1310 Dysphagia, unspecified: Secondary | ICD-10-CM

## 2016-06-22 DIAGNOSIS — K219 Gastro-esophageal reflux disease without esophagitis: Secondary | ICD-10-CM

## 2016-07-30 ENCOUNTER — Ambulatory Visit (INDEPENDENT_AMBULATORY_CARE_PROVIDER_SITE_OTHER): Payer: BLUE CROSS/BLUE SHIELD | Admitting: Physician Assistant

## 2016-07-30 ENCOUNTER — Encounter: Payer: Self-pay | Admitting: Physician Assistant

## 2016-07-30 VITALS — BP 110/70 | HR 87 | Ht 64.0 in | Wt 132.0 lb

## 2016-07-30 DIAGNOSIS — F4323 Adjustment disorder with mixed anxiety and depressed mood: Secondary | ICD-10-CM

## 2016-07-30 DIAGNOSIS — F5102 Adjustment insomnia: Secondary | ICD-10-CM

## 2016-07-30 DIAGNOSIS — R600 Localized edema: Secondary | ICD-10-CM

## 2016-07-30 MED ORDER — HYDROCHLOROTHIAZIDE 12.5 MG PO TABS: ORAL_TABLET | ORAL | 2 refills | Status: DC

## 2016-07-30 MED ORDER — TRAZODONE HCL 50 MG PO TABS: 25.0000 mg | ORAL_TABLET | Freq: Every evening | ORAL | 2 refills | Status: DC | PRN

## 2016-07-30 NOTE — Progress Notes (Signed)
   Subjective:    Patient ID: Barbara Bauer, female    DOB: 08/22/1975, 41 y.o.   MRN: [id number]  HPI  Pt is a 41 yo female who presents to the clinic to discuss insomnia. She is a Social worker in the area. She just went through a break up in December and having some trouble with sleep. She just feels like her mind will not shut off at night and left laying there thinking of him. She has never had any problems with sleep. She sees a Social worker once a week right now. Denies any feelings of hopelessness or helplessness. She does feel rested in am. She goes to sleep pretty well but wakes up frequently.    Needs refill for as needed HCTz for bilateral leg edema. Controlled.  Review of Systems See HPI.     Objective:   Physical Exam  Constitutional: She is oriented to person, place, and time. She appears well-developed and well-nourished.  HENT:  Head: Normocephalic and atraumatic.  Cardiovascular: Normal rate, regular rhythm and normal heart sounds.   Pulmonary/Chest: Effort normal and breath sounds normal.  Neurological: She is alert and oriented to person, place, and time.  Psychiatric: She has a normal mood and affect. Her behavior is normal.          Assessment & Plan:  Marland KitchenMarland KitchenEtheleen was seen today for insomnia.  Diagnoses and all orders for this visit:  Adjustment disorder with mixed anxiety and depressed mood  Adjustment insomnia -     traZODone (DESYREL) 50 MG tablet; Take 0.5-1 tablets (25-50 mg total) by mouth at bedtime as needed for sleep.  Bilateral edema of lower extremity -     hydrochlorothiazide (HYDRODIURIL) 12.5 MG tablet; Take 1-2 tablets as needed daily for lower extremity swelling.  .. Depression screen PHQ 2/9 07/30/2016  Decreased Interest 1  Down, Depressed, Hopeless 1  PHQ - 2 Score 2   Continue with counseling.  Started trazodone for sleep.  She is not interested in any SSRI.  Follow up in 2 months.

## 2016-08-05 ENCOUNTER — Ambulatory Visit (INDEPENDENT_AMBULATORY_CARE_PROVIDER_SITE_OTHER): Payer: BLUE CROSS/BLUE SHIELD | Admitting: Obstetrics & Gynecology

## 2016-08-05 ENCOUNTER — Encounter: Payer: Self-pay | Admitting: Obstetrics & Gynecology

## 2016-08-05 VITALS — BP 139/75 | HR 99 | Resp 16 | Ht 66.0 in | Wt 132.0 lb

## 2016-08-05 DIAGNOSIS — Z113 Encounter for screening for infections with a predominantly sexual mode of transmission: Secondary | ICD-10-CM

## 2016-08-05 DIAGNOSIS — Z1231 Encounter for screening mammogram for malignant neoplasm of breast: Secondary | ICD-10-CM

## 2016-08-05 DIAGNOSIS — Z01419 Encounter for gynecological examination (general) (routine) without abnormal findings: Secondary | ICD-10-CM

## 2016-08-05 MED ORDER — IMIQUIMOD 5 % EX CREA: TOPICAL_CREAM | CUTANEOUS | 0 refills | Status: DC

## 2016-08-05 MED ORDER — VALACYCLOVIR HCL 500 MG PO TABS: 500.0000 mg | ORAL_TABLET | Freq: Every day | ORAL | 12 refills | Status: DC

## 2016-08-05 MED ORDER — ESTRADIOL 10 MCG VA TABS: ORAL_TABLET | VAGINAL | 3 refills | Status: DC

## 2016-08-05 NOTE — Progress Notes (Addendum)
   Subjective:    Patient ID: Barbara Bauer, female    DOB: [birth year], 41 y.o.   MRN: [id number]  C.C. Is a 41 year old female who presents today for "new breakouts" on thighs and perineum area.  Would like to discuss tubal ligation, as she reports having an  Abortion in Jan of this year.  As been using condoms and nuvaring,but would like to be finished with hormones      Gynecologic Exam  Associated symptoms include rash.      Review of Systems  Constitutional: Positive for appetite change and fatigue.  HENT: Negative.   Eyes: Negative.   Respiratory: Negative.   Cardiovascular: Negative.   Gastrointestinal: Negative.   Genitourinary: Negative.   Musculoskeletal: Negative.   Skin: Positive for rash.  Allergic/Immunologic: Negative.   Neurological: Negative.   Psychiatric/Behavioral: The patient is nervous/anxious.        Objective:   Physical Exam  Constitutional: She is oriented to person, place, and time. She appears well-developed and well-nourished.  HENT:  Head: Normocephalic and atraumatic.  Neck: Normal range of motion.  Cardiovascular: Normal rate, regular rhythm and normal heart sounds.   Pulmonary/Chest: Effort normal and breath sounds normal.  Abdominal: Soft. Bowel sounds are normal.  Genitourinary:     Genitourinary Comments: Several noted mullsocums lesions on inner thighs. With a prominent lesion on the Right buttock.   Herpes lesion at the fourchette of the vagina-noted in blue on diagram.  Musculoskeletal: Normal range of motion.  Neurological: She is alert and oriented to person, place, and time.  Skin: Skin is warm and dry.  Psychiatric: She has a normal mood and affect. Her behavior is normal. Judgment and thought content normal.   Past Medical History:  Diagnosis Date  . Eosinophilia   . GERD (gastroesophageal reflux disease)   . HPV in female   . Thyroid goiter           Assessment & Plan:  Exam today in office was  consistent for mollescum lesions and a herpetic lesion. Provided valtrex for herpes breakout, advised its use for on-going symptomatic prevention as well as counseled on education to future partners for both informing them and how she prevents the spread of herpes to them.    Provided Aldara for the mollescum lesions and explained how to use it.   Advised to return to clinic should these fail to improve with treatment or if symptoms worsen.  Patient desires permanent sterilization.  Other reversible forms of contraception were discussed with patient; she declines all other modalities. Risks of procedure discussed with patient including but not limited to: risk of regret, permanence of method, bleeding, infection, injury to surrounding organs and need for additional procedures.  Failure risk of 0.5-1% with increased risk of ectopic gestation if pregnancy occurs was also discussed with patient.    Patient verbalized understanding of these risks and wants to proceed with sterilization.     Will plan for tubal ligation in June of this year.

## 2016-08-06 ENCOUNTER — Encounter (HOSPITAL_COMMUNITY): Payer: Self-pay

## 2016-08-06 LAB — HEPATITIS B SURFACE ANTIGEN: Hepatitis B Surface Ag: NEGATIVE

## 2016-08-06 LAB — HEPATITIS C ANTIBODY: HCV Ab: NEGATIVE

## 2016-08-06 LAB — HIV ANTIBODY (ROUTINE TESTING W REFLEX): HIV 1&2 Ab, 4th Generation: NONREACTIVE

## 2016-08-06 LAB — RPR

## 2016-08-07 LAB — URINE CYTOLOGY ANCILLARY ONLY
Chlamydia: NEGATIVE
NEISSERIA GONORRHEA: NEGATIVE

## 2016-08-11 ENCOUNTER — Telehealth: Payer: Self-pay | Admitting: *Deleted

## 2016-08-11 NOTE — Telephone Encounter (Signed)
Pt notified of neg STI labwork.

## 2016-08-11 NOTE — Telephone Encounter (Signed)
-----   Message from Guss Bunde, MD sent at 08/09/2016  4:42 PM EDT ----- Negative cultures!

## 2016-09-03 ENCOUNTER — Ambulatory Visit (INDEPENDENT_AMBULATORY_CARE_PROVIDER_SITE_OTHER): Payer: BLUE CROSS/BLUE SHIELD

## 2016-09-03 DIAGNOSIS — Z1231 Encounter for screening mammogram for malignant neoplasm of breast: Secondary | ICD-10-CM

## 2016-09-25 NOTE — Patient Instructions (Addendum)
Your procedure is scheduled on:  Friday, June 8  Enter through the [employer] [facility] at: [place] up the phone at the desk and dial [phone].  Call this number if you have problems the morning of surgery: [phone].  Remember: Do NOT eat food after midnight Thursday  Do NOT drink clear liquids after 7:30 am Friday, day of surgery  Take these medicines the morning of surgery with a SIP OF WATER:  Omeprazole, valtrex   Do NOT wear jewelry (body piercing), metal hair clips/bobby pins, make-up, or nail polish. Do NOT wear lotions, powders, or perfumes.  You may wear deoderant. Do NOT shave for 48 hours prior to surgery. Do NOT bring valuables to the hospital. Contacts may not be worn into surgery.  Have a responsible adult drive you home and stay with you for 24 hours after your procedure.  Home with [name] cell [phone].

## 2016-09-26 ENCOUNTER — Other Ambulatory Visit: Payer: Self-pay

## 2016-09-26 ENCOUNTER — Encounter (HOSPITAL_COMMUNITY): Payer: Self-pay

## 2016-09-26 ENCOUNTER — Encounter (HOSPITAL_COMMUNITY)
Admission: RE | Admit: 2016-09-26 | Discharge: 2016-09-26 | Disposition: A | Discharge status: Home / Self Care | Payer: BLUE CROSS/BLUE SHIELD | Source: Ambulatory Visit | Attending: Obstetrics & Gynecology | Admitting: Obstetrics & Gynecology

## 2016-09-26 DIAGNOSIS — Z01818 Encounter for other preprocedural examination: Secondary | ICD-10-CM | POA: Insufficient documentation

## 2016-09-26 HISTORY — DX: Anogenital (venereal) warts: A63.0

## 2016-09-26 HISTORY — DX: Nontoxic goiter, unspecified: E04.9

## 2016-09-26 HISTORY — DX: Hyperlipidemia, unspecified: E78.5

## 2016-09-26 HISTORY — DX: Localized edema: R60.0

## 2016-09-26 LAB — CBC
HEMATOCRIT: 39.3 % (ref 36.0–46.0)
HEMOGLOBIN: 13.5 g/dL (ref 12.0–15.0)
MCH: 29.3 pg (ref 26.0–34.0)
MCHC: 34.4 g/dL (ref 30.0–36.0)
MCV: 85.2 fL (ref 78.0–100.0)
Platelets: 364 10*3/uL (ref 150–400)
RBC: 4.61 MIL/uL (ref 3.87–5.11)
RDW: 13.9 % (ref 11.5–15.5)
WBC: 9.7 10*3/uL (ref 4.0–10.5)

## 2016-09-26 LAB — TYPE AND SCREEN
ABO/RH(D): O POS
ANTIBODY SCREEN: NEGATIVE

## 2016-09-26 LAB — ABO/RH: ABO/RH(D): O POS

## 2016-09-26 MED ORDER — MISOPROSTOL 200 MCG PO TABS
ORAL_TABLET | ORAL | Status: AC
  Filled 2016-09-26: qty 5

## 2016-10-03 ENCOUNTER — Encounter (HOSPITAL_COMMUNITY): Payer: Self-pay

## 2016-10-03 ENCOUNTER — Ambulatory Visit (HOSPITAL_COMMUNITY): Payer: BLUE CROSS/BLUE SHIELD | Admitting: Anesthesiology

## 2016-10-03 ENCOUNTER — Encounter (HOSPITAL_COMMUNITY)
Admission: RE | Disposition: A | Discharge status: Home / Self Care | Payer: Self-pay | Source: Ambulatory Visit | Attending: Obstetrics & Gynecology

## 2016-10-03 ENCOUNTER — Ambulatory Visit (HOSPITAL_COMMUNITY)
Admission: RE | Admit: 2016-10-03 | Discharge: 2016-10-03 | Disposition: A | Discharge status: Home / Self Care | Payer: BLUE CROSS/BLUE SHIELD | Source: Ambulatory Visit | Attending: Obstetrics & Gynecology | Admitting: Obstetrics & Gynecology

## 2016-10-03 DIAGNOSIS — Z803 Family history of malignant neoplasm of breast: Secondary | ICD-10-CM | POA: Insufficient documentation

## 2016-10-03 DIAGNOSIS — Z8249 Family history of ischemic heart disease and other diseases of the circulatory system: Secondary | ICD-10-CM | POA: Insufficient documentation

## 2016-10-03 DIAGNOSIS — Z880 Allergy status to penicillin: Secondary | ICD-10-CM | POA: Diagnosis not present

## 2016-10-03 DIAGNOSIS — E785 Hyperlipidemia, unspecified: Secondary | ICD-10-CM | POA: Insufficient documentation

## 2016-10-03 DIAGNOSIS — K219 Gastro-esophageal reflux disease without esophagitis: Secondary | ICD-10-CM | POA: Diagnosis not present

## 2016-10-03 DIAGNOSIS — E049 Nontoxic goiter, unspecified: Secondary | ICD-10-CM | POA: Diagnosis not present

## 2016-10-03 DIAGNOSIS — Z302 Encounter for sterilization: Secondary | ICD-10-CM | POA: Insufficient documentation

## 2016-10-03 DIAGNOSIS — Z79899 Other long term (current) drug therapy: Secondary | ICD-10-CM | POA: Insufficient documentation

## 2016-10-03 HISTORY — PX: LAPAROSCOPIC TUBAL LIGATION: SHX1937

## 2016-10-03 LAB — PREGNANCY, URINE: Preg Test, Ur: NEGATIVE

## 2016-10-03 SURGERY — LIGATION, FALLOPIAN TUBE, LAPAROSCOPIC
Anesthesia: General | Site: Abdomen | Laterality: Bilateral

## 2016-10-03 MED ORDER — ROCURONIUM BROMIDE 100 MG/10ML IV SOLN
INTRAVENOUS | Status: DC | PRN
  Administered 2016-10-03: 30 mg via INTRAVENOUS

## 2016-10-03 MED ORDER — MEPERIDINE HCL 25 MG/ML IJ SOLN: 6.2500 mg | INTRAMUSCULAR | Status: DC | PRN

## 2016-10-03 MED ORDER — OXYCODONE HCL 5 MG PO TABS: 5.0000 mg | ORAL_TABLET | Freq: Once | ORAL | Status: DC | PRN

## 2016-10-03 MED ORDER — BUPIVACAINE HCL (PF) 0.25 % IJ SOLN
INTRAMUSCULAR | Status: AC
  Filled 2016-10-03: qty 30

## 2016-10-03 MED ORDER — MIDAZOLAM HCL 2 MG/2ML IJ SOLN
INTRAMUSCULAR | Status: AC
  Filled 2016-10-03: qty 2

## 2016-10-03 MED ORDER — LIDOCAINE HCL (CARDIAC) 20 MG/ML IV SOLN
INTRAVENOUS | Status: DC | PRN
  Administered 2016-10-03: 100 mg via INTRAVENOUS

## 2016-10-03 MED ORDER — FENTANYL CITRATE (PF) 100 MCG/2ML IJ SOLN
INTRAMUSCULAR | Status: AC
  Filled 2016-10-03: qty 2

## 2016-10-03 MED ORDER — SCOPOLAMINE 1 MG/3DAYS TD PT72
MEDICATED_PATCH | TRANSDERMAL | Status: AC
  Filled 2016-10-03: qty 1

## 2016-10-03 MED ORDER — BUPIVACAINE HCL (PF) 0.25 % IJ SOLN
INTRAMUSCULAR | Status: DC | PRN
  Administered 2016-10-03: 10 mL
  Administered 2016-10-03: 20 mL

## 2016-10-03 MED ORDER — HYDROCODONE-ACETAMINOPHEN 5-325 MG PO TABS: ORAL_TABLET | ORAL | 0 refills | Status: DC

## 2016-10-03 MED ORDER — HYDROCODONE-ACETAMINOPHEN 5-325 MG PO TABS: 1.0000 | ORAL_TABLET | ORAL | 0 refills | Status: DC | PRN

## 2016-10-03 MED ORDER — LACTATED RINGERS IV SOLN
INTRAVENOUS | Status: DC
  Administered 2016-10-03 (×2): via INTRAVENOUS
  Administered 2016-10-03: 1000 mL via INTRAVENOUS

## 2016-10-03 MED ORDER — KETOROLAC TROMETHAMINE 30 MG/ML IJ SOLN: 30.0000 mg | Freq: Once | INTRAMUSCULAR | Status: DC | PRN

## 2016-10-03 MED ORDER — LACTATED RINGERS IV SOLN: INTRAVENOUS | Status: DC

## 2016-10-03 MED ORDER — PROMETHAZINE HCL 25 MG/ML IJ SOLN: 6.2500 mg | INTRAMUSCULAR | Status: DC | PRN

## 2016-10-03 MED ORDER — MIDAZOLAM HCL 2 MG/2ML IJ SOLN
INTRAMUSCULAR | Status: DC | PRN
  Administered 2016-10-03: 2 mg via INTRAVENOUS

## 2016-10-03 MED ORDER — OXYCODONE HCL 5 MG/5ML PO SOLN: 5.0000 mg | Freq: Once | ORAL | Status: DC | PRN

## 2016-10-03 MED ORDER — IBUPROFEN 600 MG PO TABS: 600.0000 mg | ORAL_TABLET | Freq: Four times a day (QID) | ORAL | 1 refills | Status: DC | PRN

## 2016-10-03 MED ORDER — SUGAMMADEX SODIUM 200 MG/2ML IV SOLN
INTRAVENOUS | Status: DC | PRN
  Administered 2016-10-03: 119.8 mg via INTRAVENOUS

## 2016-10-03 MED ORDER — SCOPOLAMINE 1 MG/3DAYS TD PT72
1.0000 | MEDICATED_PATCH | Freq: Once | TRANSDERMAL | Status: DC
  Administered 2016-10-03: 1.5 mg via TRANSDERMAL

## 2016-10-03 MED ORDER — PROPOFOL 10 MG/ML IV BOLUS
INTRAVENOUS | Status: DC | PRN
  Administered 2016-10-03: 100 mg via INTRAVENOUS

## 2016-10-03 MED ORDER — FENTANYL CITRATE (PF) 100 MCG/2ML IJ SOLN
INTRAMUSCULAR | Status: DC | PRN
  Administered 2016-10-03 (×2): 100 ug via INTRAVENOUS

## 2016-10-03 MED ORDER — DEXAMETHASONE SODIUM PHOSPHATE 10 MG/ML IJ SOLN
INTRAMUSCULAR | Status: DC | PRN
  Administered 2016-10-03: 4 mg via INTRAVENOUS

## 2016-10-03 MED ORDER — HYDROMORPHONE HCL 1 MG/ML IJ SOLN: 0.2500 mg | INTRAMUSCULAR | Status: DC | PRN

## 2016-10-03 MED ORDER — ONDANSETRON HCL 4 MG/2ML IJ SOLN
INTRAMUSCULAR | Status: DC | PRN
  Administered 2016-10-03: 4 mg via INTRAVENOUS

## 2016-10-03 SURGICAL SUPPLY — 26 items
APPLICATOR COTTON TIP 6IN STRL (MISCELLANEOUS) ×2 IMPLANT
BLADE SURG 15 STRL LF C SS BP (BLADE) ×1 IMPLANT
BLADE SURG 15 STRL SS (BLADE) ×1
CATH ROBINSON RED A/P 16FR (CATHETERS) ×2 IMPLANT
CLIP FILSHIE TUBAL LIGA STRL (Clip) ×2 IMPLANT
CLOTH BEACON ORANGE TIMEOUT ST (SAFETY) ×2 IMPLANT
DERMABOND ADVANCED (GAUZE/BANDAGES/DRESSINGS) ×1
DERMABOND ADVANCED .7 DNX12 (GAUZE/BANDAGES/DRESSINGS) ×1 IMPLANT
DRSG OPSITE POSTOP 3X4 (GAUZE/BANDAGES/DRESSINGS) ×2 IMPLANT
DURAPREP 26ML APPLICATOR (WOUND CARE) ×2 IMPLANT
GLOVE BIO SURGEON STRL SZ7 (GLOVE) ×2 IMPLANT
GLOVE BIOGEL PI IND STRL 7.0 (GLOVE) ×2 IMPLANT
GLOVE BIOGEL PI INDICATOR 7.0 (GLOVE) ×2
GOWN STRL REUS W/TWL LRG LVL3 (GOWN DISPOSABLE) ×4 IMPLANT
PACK LAPAROSCOPY BASIN (CUSTOM PROCEDURE TRAY) ×2 IMPLANT
PACK TRENDGUARD 450 HYBRID PRO (MISCELLANEOUS) ×1 IMPLANT
PACK TRENDGUARD 600 HYBRD PROC (MISCELLANEOUS) IMPLANT
PROTECTOR NERVE ULNAR (MISCELLANEOUS) ×2 IMPLANT
SLEEVE XCEL OPT CAN 5 100 (ENDOMECHANICALS) ×2 IMPLANT
SUT VICRYL 0 UR6 27IN ABS (SUTURE) ×2 IMPLANT
SUT VICRYL 4-0 PS2 18IN ABS (SUTURE) ×2 IMPLANT
TOWEL OR 17X24 6PK STRL BLUE (TOWEL DISPOSABLE) ×4 IMPLANT
TRENDGUARD 450 HYBRID PRO PACK (MISCELLANEOUS) ×2
TRENDGUARD 600 HYBRID PROC PK (MISCELLANEOUS)
TROCAR BALLN 12MMX100 BLUNT (TROCAR) ×2 IMPLANT
TROCAR XCEL NON-BLD 11X100MML (ENDOMECHANICALS) ×2 IMPLANT

## 2016-10-03 NOTE — Brief Op Note (Signed)
10/03/2016  2:13 PM  PATIENT:  Barbara Bauer  41 y.o. female  PRE-OPERATIVE DIAGNOSIS:  Undesired fertility  POST-OPERATIVE DIAGNOSIS:  Undesired fertility  PROCEDURE:  Procedure(s): LAPAROSCOPIC TUBAL LIGATION (Bilateral)  SURGEON:  Surgeon(s) and Role:    * Guss Bunde, MD - Primary  PHYSICIAN ASSISTANT: None  ASSISTANTS: none   ANESTHESIA:   local and IV sedation  EBL:  Total I/O In: 1000 [I.V.:1000] Out: 20 [Urine:20]  BLOOD ADMINISTERED:none  DRAINS: none   LOCAL MEDICATIONS USED:  LIDOCAINE   SPECIMEN:  No Specimen  DISPOSITION OF SPECIMEN:  N/A  COUNTS:  YES  TOURNIQUET:  * No tourniquets in log *  DICTATION: .Note written in EPIC  PLAN OF CARE: Discharge to home after PACU  PATIENT DISPOSITION:  PACU - hemodynamically stable.   Delay start of Pharmacological VTE agent (>24hrs) due to surgical blood loss or risk of bleeding: not applicable

## 2016-10-03 NOTE — Transfer of Care (Signed)
Immediate Anesthesia Transfer of Care Note  Patient: Barbara Bauer  Procedure(s) Performed: Procedure(s): LAPAROSCOPIC TUBAL LIGATION (Bilateral)  Patient Location: PACU  Anesthesia Type:General  Level of Consciousness: awake, alert  and oriented  Airway & Oxygen Therapy: Patient Spontanous Breathing and Patient connected to nasal cannula oxygen  Post-op Assessment: Report given to RN and Post -op Vital signs reviewed and stable  Post vital signs: Reviewed and stable  Last Vitals:  Vitals:   10/03/16 1222  BP: 119/77  Pulse: 77  Resp: 16  Temp: 36.7 C    Last Pain:  Vitals:   10/03/16 1222  TempSrc: Oral      Patients Stated Pain Goal: 4 (40/35/24 8185)  Complications: No apparent anesthesia complications

## 2016-10-03 NOTE — Anesthesia Postprocedure Evaluation (Signed)
Anesthesia Post Note  Patient: Barbara Bauer  Procedure(s) Performed: Procedure(s) (LRB): LAPAROSCOPIC TUBAL LIGATION (Bilateral)     Patient location during evaluation: PACU Anesthesia Type: General Level of consciousness: sedated and patient cooperative Pain management: pain level controlled Vital Signs Assessment: post-procedure vital signs reviewed and stable Respiratory status: spontaneous breathing Cardiovascular status: stable Anesthetic complications: no    Last Vitals:  Vitals:   10/03/16 1515 10/03/16 1545  BP: 113/68   Pulse: 71   Resp: 19   Temp:  (P) 37.3 C    Last Pain:  Vitals:   10/03/16 1530  TempSrc:   PainSc: 4    Pain Goal: Patients Stated Pain Goal: 4 (10/03/16 1515)               Nolon Nations

## 2016-10-03 NOTE — H&P (Signed)
Barbara Bauer is an 41 y.o. female presents for laparoscopic BTL.     Menstrual History:  Patient's last menstrual period was 09/26/2016.    Past Medical History:  Diagnosis Date  . Edema leg   . Eosinophilia   . Genital warts   . GERD (gastroesophageal reflux disease)   . Goiter    MD watching - no treatment  . HPV in female   . Hyperlipidemia    diet controlled  . Thyroid goiter     Past Surgical History:  Procedure Laterality Date  . ESOPHAGOGASTRODUODENOSCOPY ENDOSCOPY    . EYE SURGERY Bilateral    lasik   . UPPER GASTROINTESTINAL ENDOSCOPY    . WISDOM TOOTH EXTRACTION      Family History  Problem Relation Age of Onset  . Other Father        Pityriasis Ruba Pilaris  . Breast cancer Maternal Grandmother   . Heart attack Maternal Grandfather   . Colon cancer Neg Hx   . Esophageal cancer Neg Hx   . Stomach cancer Neg Hx   . Pancreatic cancer Neg Hx   . Colon polyps Neg Hx   . Diabetes Neg Hx   . Kidney disease Neg Hx   . Liver disease Neg Hx   . Rectal cancer Neg Hx     Social History:  reports that she has never smoked. She has never used smokeless tobacco. She reports that she drinks alcohol. She reports that she does not use drugs.  Allergies:  Allergies  Allergen Reactions  . Penicillins Rash    Has patient had a PCN reaction causing immediate rash, facial/tongue/throat swelling, SOB or lightheadedness with hypotension: Yes Has patient had a PCN reaction causing severe rash involving mucus membranes or skin necrosis: No Has patient had a PCN reaction that required hospitalization: No Has patient had a PCN reaction occurring within the last 10 years: No If all of the above answers are "NO", then may proceed with Cephalosporin use.     Prescriptions Prior to Admission  Medication Sig Dispense Refill Last Dose  . etonogestrel-ethinyl estradiol (NUVARING) 0.12-0.015 MG/24HR vaginal ring Place 1 each vaginally every 28 (twenty-eight) days. Insert  vaginally and leave in place for 3 consecutive weeks, then remove for 1 week.   10/03/2016 at Unknown time  . hydrochlorothiazide (HYDRODIURIL) 12.5 MG tablet Take 1-2 tablets as needed daily for lower extremity swelling. (Patient taking differently: Take 12.5 mg by mouth 2 (two) times a week. May take 12.5 daily as needed for water retention) 60 tablet 2 Past Week at Unknown time  . Multiple Vitamin (MULTIVITAMIN) tablet Take 1 tablet by mouth daily.   10/02/2016 at Unknown time  . omeprazole (PRILOSEC) 40 MG capsule TAKE ONE CAPSULE BY MOUTH ONCE DAILY (Patient taking differently: Take 40 mgs by mouth once daily as needed for acid reflux) 90 capsule 1 10/03/2016 at 0630  . oxymetazoline (AFRIN) 0.05 % nasal spray Place 1 spray into both nostrils at bedtime as needed for congestion.   10/02/2016 at Unknown time  . Probiotic Product (PROBIOTIC DAILY PO) Take 1 capsule by mouth daily.    10/02/2016 at Unknown time  . valACYclovir (VALTREX) 500 MG tablet Take 1 tablet (500 mg total) by mouth daily. 30 tablet 12 10/03/2016 at 0700  . imiquimod (ALDARA) 5 % cream Apply topically 3 (three) times a week. (Patient not taking: Reported on 09/24/2016) 12 each 0 Not Taking at Unknown time  . traZODone (DESYREL) 50 MG tablet Take 0.5-1  tablets (25-50 mg total) by mouth at bedtime as needed for sleep. (Patient not taking: Reported on 08/05/2016) 30 tablet 2 Not Taking at Unknown time    Review of Systems  Constitutional: Negative.   Respiratory: Negative.   Cardiovascular: Negative.   Gastrointestinal: Negative.   Genitourinary: Negative.   Musculoskeletal: Negative.   Neurological: Negative.   Psychiatric/Behavioral: Negative.     Blood pressure 119/77, pulse 77, temperature 98 F (36.7 C), temperature source Oral, resp. rate 16, last menstrual period 09/26/2016, SpO2 100 %. Physical Exam  Vitals reviewed. Constitutional: She is oriented to person, place, and time. She appears well-developed and well-nourished. No  distress.  HENT:  Head: Normocephalic and atraumatic.  Eyes: Conjunctivae are normal.  Neck: Neck supple. No thyromegaly present.  Cardiovascular: Normal rate and regular rhythm.   Respiratory: Effort normal and breath sounds normal.  GI: Soft. There is no tenderness. There is no rebound and no guarding.  No hernia at umbilicus   Musculoskeletal: Normal range of motion. She exhibits no tenderness.  Neurological: She is alert and oriented to person, place, and time.  Skin: Skin is warm and dry.  Psychiatric: She has a normal mood and affect.    Results for orders placed or performed during the hospital encounter of 10/03/16 (from the past 24 hour(s))  Pregnancy, urine     Status: None   Collection Time: 10/03/16 12:00 PM  Result Value Ref Range   Preg Test, Ur NEGATIVE NEGATIVE   CBC    Component Value Date/Time   WBC 9.7 09/26/2016 1235   RBC 4.61 09/26/2016 1235   HGB 13.5 09/26/2016 1235   HCT 39.3 09/26/2016 1235   PLT 364 09/26/2016 1235   MCV 85.2 09/26/2016 1235   MCH 29.3 09/26/2016 1235   MCHC 34.4 09/26/2016 1235   RDW 13.9 09/26/2016 1235   LYMPHSABS 1.4 01/10/2015 1023   MONOABS 0.6 01/10/2015 1023   EOSABS 0.1 01/10/2015 1023   BASOSABS 0.1 01/10/2015 1023    Assessment/Plan: 41 yo female with undesired fertility for Laparoscopic BTL with Filshie Clips  Patient desires permanent sterilization.  Other reversible forms of contraception were discussed with patient; she declines all other modalities. Risks of procedure discussed with patient including but not limited to: risk of regret, permanence of method, bleeding, infection, injury to surrounding organs and need for additional procedures.  Failure risk of 0.5-1% with increased risk of ectopic gestation if pregnancy occurs was also discussed with patient.    Patient verbalized understanding of these risks and wants to proceed with sterilization.  Written informed consent obtained.  To OR when ready.   Silas Sacramento 10/03/2016, 1:10 PM

## 2016-10-03 NOTE — Op Note (Signed)
Barbara Bauer 10/03/2016  PREOPERATIVE DIAGNOSIS:  Undesired fertility  POSTOPERATIVE DIAGNOSIS:  Undesired fertility  PROCEDURE:  Laparoscopic Bilateral Tubal Sterilization using Filshie Clips   ANESTHESIA:  General endotracheal  COMPLICATIONS:  None immediate.  ESTIMATED BLOOD LOSS:  Less than 20 ml.  INDICATIONS: 41 y.o. G0P0000  with undesired fertility, desires permanent sterilization. Other reversible forms of contraception were discussed with patient; she declines all other modalities.  Risks of procedure discussed with patient including permanence of method, bleeding, infection, injury to surrounding organs and need for additional procedures including laparotomy, risk of regret.  Failure risk of 0.5-1% with increased risk of ectopic gestation if pregnancy occurs was also discussed with patient.      FINDINGS:  Normal uterus, tubes, and ovaries.  TECHNIQUE:  The patient was taken to the operating room where general anesthesia was obtained without difficulty.  She was then placed in the dorsal lithotomy position and prepared and draped in sterile fashion.  After an adequate timeout was performed, a bivalved speculum was then placed in the patient's vagina, and the anterior lip of cervix grasped with the single-tooth tenaculum.  The uterine manipulator was then advanced into the uterus.  The speculum was removed from the vagina.  Attention was then turned to the patient's abdomen where a 10-mm skin incision was made on the umbilical fold.  Using the Optiview trocar, the 10/11 XL trocar and sleeve were then advanced without difficulty into the abdomen where intraabdominal placement was confirmed by the laparoscope. A survey of the patient's pelvis and abdomen revealed entirely normal anatomy.  The fallopian tubes were observed and found to be normal in appearance. The Filshie clip applicator was placed through the operative port, and a Filshie clip was placed on the right fallopian tube  ,about 2 cm from the cornual attachment, with care given to incorporate the underlying mesosalpinx.  A similar process was carried out on the contralateral side allowing for bilateral tubal sterilization.   Good hemostasis was noted overall.  Lidocaine was drizzled over fallopian tubes.  The instruments were then removed from the patient's abdomen.  The facial incision was less than 1 cm from use of the 10/11 XL trocar and did not need to be closed. The skin was closed with 4-0 vicryl with good hemostasis and restoration of anatomy.  Ten cc of lidocaine was injected into the umbilical skin.  The uterine manipulator was removed from the vagina without complications. The patient tolerated the procedure well.  Sponge, lap, and needle counts were correct times two.  The patient was then taken to the recovery room awake, extubated and in stable condition.

## 2016-10-03 NOTE — Anesthesia Preprocedure Evaluation (Signed)
Anesthesia Evaluation  Patient identified by MRN, date of birth, ID band Patient awake    Reviewed: Allergy & Precautions, NPO status , Patient's Chart, lab work & pertinent test results  Airway Mallampati: II  TM Distance: >3 FB Neck ROM: Full    Dental no notable dental hx.    Pulmonary neg pulmonary ROS,    Pulmonary exam normal breath sounds clear to auscultation       Cardiovascular negative cardio ROS Normal cardiovascular exam Rhythm:Regular Rate:Normal     Neuro/Psych PSYCHIATRIC DISORDERS negative neurological ROS     GI/Hepatic Neg liver ROS, GERD  ,  Endo/Other  negative endocrine ROS  Renal/GU negative Renal ROS     Musculoskeletal negative musculoskeletal ROS (+)   Abdominal   Peds  Hematology negative hematology ROS (+)   Anesthesia Other Findings   Reproductive/Obstetrics negative OB ROS                             Anesthesia Physical Anesthesia Plan  ASA: II  Anesthesia Plan: General   Post-op Pain Management:    Induction: Intravenous  PONV Risk Score and Plan: 3 and Ondansetron, Dexamethasone, Propofol, Midazolam and Scopolamine patch - Pre-op  Airway Management Planned:   Additional Equipment:   Intra-op Plan:   Post-operative Plan: Extubation in OR  Informed Consent: I have reviewed the patients History and Physical, chart, labs and discussed the procedure including the risks, benefits and alternatives for the proposed anesthesia with the patient or authorized representative who has indicated his/her understanding and acceptance.   Dental advisory given  Plan Discussed with: CRNA  Anesthesia Plan Comments:         Anesthesia Quick Evaluation

## 2016-10-03 NOTE — Discharge Instructions (Addendum)

## 2016-10-03 NOTE — Anesthesia Procedure Notes (Signed)
Procedure Name: Intubation Date/Time: 10/03/2016 1:30 PM Performed by: [employer], Sheron Nightingale Pre-anesthesia Checklist: Patient identified, Emergency Drugs available, Suction available, Patient being monitored and Timeout performed Patient Re-evaluated:Patient Re-evaluated prior to inductionOxygen Delivery Method: Circle system utilized Preoxygenation: Pre-oxygenation with 100% oxygen Intubation Type: IV induction Ventilation: Mask ventilation without difficulty Laryngoscope Size: Mac and 3 Grade View: Grade I Tube type: Oral Tube size: 7.0 mm Number of attempts: 1 Placement Confirmation: ETT inserted through vocal cords under direct vision,  breath sounds checked- equal and bilateral and positive ETCO2 Secured at: 21 cm Dental Injury: Teeth and Oropharynx as per pre-operative assessment

## 2016-10-04 ENCOUNTER — Encounter (HOSPITAL_COMMUNITY): Payer: Self-pay | Admitting: Obstetrics & Gynecology

## 2016-10-22 ENCOUNTER — Encounter: Payer: Self-pay | Admitting: Obstetrics & Gynecology

## 2016-10-22 ENCOUNTER — Ambulatory Visit (INDEPENDENT_AMBULATORY_CARE_PROVIDER_SITE_OTHER): Payer: BLUE CROSS/BLUE SHIELD | Admitting: Obstetrics & Gynecology

## 2016-10-22 VITALS — BP 120/78 | HR 92 | Ht 67.0 in | Wt 135.0 lb

## 2016-10-22 DIAGNOSIS — N898 Other specified noninflammatory disorders of vagina: Secondary | ICD-10-CM

## 2016-10-22 DIAGNOSIS — Z113 Encounter for screening for infections with a predominantly sexual mode of transmission: Secondary | ICD-10-CM | POA: Diagnosis not present

## 2016-10-22 MED ORDER — FLUCONAZOLE 150 MG PO TABS: 150.0000 mg | ORAL_TABLET | Freq: Once | ORAL | 0 refills | Status: AC

## 2016-10-22 NOTE — Progress Notes (Signed)
   Subjective:    Patient ID: Barbara Bauer, female    DOB: 1975-08-30, 41 y.o.   MRN: [id number]  HPI  Pt here for post op evaluation after [facility] BTL as well as vaginal discharge.  Pt can still feel vicryl knot.  Pt has taken OTC monistat without relief.  +sexual intercourse.  Started menses today.  Review of Systems  Constitutional: Negative.   Cardiovascular: Negative.   Genitourinary: Positive for vaginal bleeding. Negative for menstrual problem, pelvic pain and vaginal discharge.  Psychiatric/Behavioral: Negative.        Objective:   Physical Exam  Constitutional: She is oriented to person, place, and time. She appears well-developed and well-nourished. No distress.  HENT:  Head: Normocephalic and atraumatic.  Eyes: Conjunctivae are normal.  Pulmonary/Chest: Effort normal.  Abdominal: Soft. Bowel sounds are normal. She exhibits no distension. There is no tenderness. There is no rebound.  Genitourinary:  Genitourinary Comments: Vulva:  No lesion Vagina:  Scan blood and some thick d/c Cervix:  Closed   Musculoskeletal: She exhibits no edema.  Neurological: She is alert and oriented to person, place, and time.  Skin: Skin is warm and dry.  Psychiatric: She has a normal mood and affect.  Vitals reviewed.  Vitals:   10/22/16 0919  BP: 120/78  Pulse: 92  Weight: 135 lb (61.2 kg)  Height: 5\' 7"  (1.702 m)    Assessment & Plan:  41 yo female with vicryl knot at umbilicus s/p [facility] BTL and vaginal discharge  1-Knot removed 2-culture and wet prep sent; treat based on visual inspection with Diflucan 3-Mammogram and Pap up to date.

## 2016-10-23 LAB — CERVICOVAGINAL ANCILLARY ONLY
BACTERIAL VAGINITIS: NEGATIVE
CHLAMYDIA, DNA PROBE: NEGATIVE
Candida vaginitis: NEGATIVE
NEISSERIA GONORRHEA: NEGATIVE

## 2016-10-27 ENCOUNTER — Telehealth: Payer: Self-pay | Admitting: *Deleted

## 2016-10-27 NOTE — Telephone Encounter (Signed)
LM on voicemail of normal vaginal cultures.

## 2016-10-27 NOTE — Telephone Encounter (Signed)
-----   Message from Guss Bunde, MD sent at 10/24/2016  2:17 PM EDT ----- NO evidence of infection or vaginitis

## 2016-11-28 DIAGNOSIS — H527 Unspecified disorder of refraction: Secondary | ICD-10-CM | POA: Diagnosis not present

## 2017-05-05 DIAGNOSIS — H9192 Unspecified hearing loss, left ear: Secondary | ICD-10-CM | POA: Diagnosis not present

## 2017-05-19 DIAGNOSIS — H93A2 Pulsatile tinnitus, left ear: Secondary | ICD-10-CM | POA: Diagnosis not present

## 2017-05-19 DIAGNOSIS — H9190 Unspecified hearing loss, unspecified ear: Secondary | ICD-10-CM | POA: Diagnosis not present

## 2017-06-02 ENCOUNTER — Other Ambulatory Visit: Payer: Self-pay | Admitting: Physician Assistant

## 2017-06-02 DIAGNOSIS — R600 Localized edema: Secondary | ICD-10-CM

## 2017-09-07 ENCOUNTER — Other Ambulatory Visit: Payer: Self-pay | Admitting: Obstetrics & Gynecology

## 2017-09-08 DIAGNOSIS — L538 Other specified erythematous conditions: Secondary | ICD-10-CM | POA: Diagnosis not present

## 2017-09-08 DIAGNOSIS — B081 Molluscum contagiosum: Secondary | ICD-10-CM | POA: Diagnosis not present

## 2017-09-10 ENCOUNTER — Telehealth: Payer: Self-pay

## 2017-09-10 NOTE — Telephone Encounter (Signed)
PT called for refill of Valtrex. Pharmacy also sent refill request. Refill sent.

## 2017-10-02 ENCOUNTER — Ambulatory Visit (INDEPENDENT_AMBULATORY_CARE_PROVIDER_SITE_OTHER): Payer: BLUE CROSS/BLUE SHIELD | Admitting: Certified Nurse Midwife

## 2017-10-02 ENCOUNTER — Encounter: Payer: Self-pay | Admitting: Certified Nurse Midwife

## 2017-10-02 VITALS — BP 122/71 | HR 80 | Resp 16 | Ht 65.0 in | Wt 145.0 lb

## 2017-10-02 DIAGNOSIS — Z8619 Personal history of other infectious and parasitic diseases: Secondary | ICD-10-CM | POA: Insufficient documentation

## 2017-10-02 DIAGNOSIS — Z01419 Encounter for gynecological examination (general) (routine) without abnormal findings: Secondary | ICD-10-CM | POA: Diagnosis not present

## 2017-10-02 DIAGNOSIS — Z124 Encounter for screening for malignant neoplasm of cervix: Secondary | ICD-10-CM

## 2017-10-02 DIAGNOSIS — Z1151 Encounter for screening for human papillomavirus (HPV): Secondary | ICD-10-CM

## 2017-10-02 DIAGNOSIS — E049 Nontoxic goiter, unspecified: Secondary | ICD-10-CM

## 2017-10-02 MED ORDER — VALACYCLOVIR HCL 500 MG PO TABS: 500.0000 mg | ORAL_TABLET | Freq: Two times a day (BID) | ORAL | 2 refills | Status: DC

## 2017-10-02 NOTE — Progress Notes (Signed)
Gynecology Annual Exam  PCP: Lavada Mesi  Chief Complaint:  Chief Complaint  Patient presents with  . Gynecologic Exam    History of Present Illness: Patient is a 42 y.o. G0P0000 presents for annual exam. The patient has no complaints today.   LMP: Patient's last menstrual period was 09/24/2017. Average Interval: regular, 24-29 days Duration of flow: 3 days Heavy Menses: no Clots: no Intermenstrual Bleeding: no Postcoital Bleeding: no Dysmenorrhea: minimal, uses Advil occ.  The patient is sexually active. She currently uses Female sterilization: Present: yes for contraception. She denies dyspareunia. There is notable family history of breast or ovarian cancer in her family.  The patient denies current symptoms of depression.    Review of Systems: Review of Systems  Constitutional: Negative.   Genitourinary: Negative.     Past Medical History:  Past Medical History:  Diagnosis Date  . Edema leg   . Eosinophilia   . Genital warts   . GERD (gastroesophageal reflux disease)   . Goiter    MD watching - no treatment  . HPV in female   . Hyperlipidemia    diet controlled  . Thyroid goiter     Past Surgical History:  Past Surgical History:  Procedure Laterality Date  . ESOPHAGOGASTRODUODENOSCOPY ENDOSCOPY    . EYE SURGERY Bilateral    lasik   . LAPAROSCOPIC TUBAL LIGATION Bilateral 10/03/2016   Procedure: LAPAROSCOPIC TUBAL LIGATION;  Surgeon: Guss Bunde, MD;  Location: [facility];  Service: Gynecology;  Laterality: Bilateral;  . UPPER GASTROINTESTINAL ENDOSCOPY    . WISDOM TOOTH EXTRACTION      Gynecologic History:  Patient's last menstrual period was 09/24/2017. Contraception: tubal ligation Last Pap: Results were: no abnormalities  Mammogram: 09/02/2016 normal  Obstetric History: G0P0000  Family History:  Family History  Problem Relation Age of Onset  . Other Father        Pityriasis Ruba Pilaris  . Breast cancer Maternal Grandmother   . Heart  attack Maternal Grandfather   . Colon cancer Neg Hx   . Esophageal cancer Neg Hx   . Stomach cancer Neg Hx   . Pancreatic cancer Neg Hx   . Colon polyps Neg Hx   . Diabetes Neg Hx   . Kidney disease Neg Hx   . Liver disease Neg Hx   . Rectal cancer Neg Hx     Social History:  Social History   Socioeconomic History  . Marital status: Single    Spouse name: Not on file  . Number of children: Not on file  . Years of education: Not on file  . Highest education level: Not on file  Occupational History  . Occupation: professor  Social Needs  . Financial resource strain: Not on file  . Food insecurity:    Worry: Not on file    Inability: Not on file  . Transportation needs:    Medical: Not on file    Non-medical: Not on file  Tobacco Use  . Smoking status: Never Smoker  . Smokeless tobacco: Never Used  Substance and Sexual Activity  . Alcohol use: Yes    Alcohol/week: 0.0 oz    Comment: minimal  . Drug use: No  . Sexual activity: Yes    Partners: Male    Birth control/protection: Surgical    Comment: NuvaRing  Lifestyle  . Physical activity:    Days per week: Not on file    Minutes per session: Not on file  . Stress: Not on  file  Relationships  . Social connections:    Talks on phone: Not on file    Gets together: Not on file    Attends religious service: Not on file    Active member of club or organization: Not on file    Attends meetings of clubs or organizations: Not on file    Relationship status: Not on file  . Intimate partner violence:    Fear of current or ex partner: Not on file    Emotionally abused: Not on file    Physically abused: Not on file    Forced sexual activity: Not on file  Other Topics Concern  . Not on file  Social History Narrative  . Not on file    Allergies:  Allergies  Allergen Reactions  . Penicillins Rash    Has patient had a PCN reaction causing immediate rash, facial/tongue/throat swelling, SOB or lightheadedness with  hypotension: Yes Has patient had a PCN reaction causing severe rash involving mucus membranes or skin necrosis: No Has patient had a PCN reaction that required hospitalization: No Has patient had a PCN reaction occurring within the last 10 years: No If all of the above answers are "NO", then may proceed with Cephalosporin use.     Medications: Prior to Admission medications   Medication Sig Start Date End Date Taking? Authorizing Provider  hydrochlorothiazide (HYDRODIURIL) 12.5 MG tablet TAKE 1-2 TABLETS BY MOUTH AS NEEDED DAILY FOR LOWER EXTREMITY SWELLING 06/03/17  Yes Breeback, Jade L, PA-C  ibuprofen (ADVIL,MOTRIN) 600 MG tablet Take 1 tablet (600 mg total) by mouth every 6 (six) hours as needed. 10/03/16  Yes Guss Bunde, MD  Multiple Vitamin (MULTIVITAMIN) tablet Take 1 tablet by mouth daily.   Yes [provider]  omeprazole (PRILOSEC) 40 MG capsule TAKE ONE CAPSULE BY MOUTH ONCE DAILY Patient taking differently: Take 40 mgs by mouth once daily as needed for acid reflux 06/23/16  Yes Irene Shipper, MD  Probiotic Product (PROBIOTIC DAILY PO) Take 1 capsule by mouth daily.    Yes [provider]  valACYclovir (VALTREX) 500 MG tablet Take 1 tablet (500 mg total) by mouth daily. 08/05/16  Yes Guss Bunde, MD    Physical Exam Vitals: Blood pressure 122/71, pulse 80, resp. rate 16, height 5\' 5"  (1.651 m), weight 145 lb (65.8 kg), last menstrual period 09/24/2017.  General: NAD HEENT: normocephalic, atraumatic Thyroid: ++enlargement, no palpable nodules Pulmonary: Normal rate and effort, CTAB Cardiovascular: RRR Breast: Breast symmetrical, no tenderness, no palpable nodules or masses, no skin or nipple retraction present, no nipple discharge. No axillary or supraclavicular lymphadenopathy. Abdomen: soft, non-tender, non-distended. No hepatomegaly, splenomegaly or masses palpable. No evidence of hernia  Genitourinary:  External: Normal external female genitalia.  Normal urethral meatus  Vagina: Normal vaginal mucosa, no evidence of prolapse   Cervix: Grossly normal in appearance, no bleeding  Uterus: Non-enlarged, mobile, normal contour. No CMT  Adnexa: non-enlarged, no masses  Rectal: deferred Extremities: no edema, erythema, or tenderness Neurologic: Grossly intact Psychiatric: mood appropriate, affect full  Female chaperone present for pelvic and breast portions of the physical exam  Assessment/Plan: 42 y.o. G0P0000   1. Well woman exam - last pap 2 yrs ago, pt requests rpt today d/t re-newed relationship with previous partner - declines STD testing- recent was negative - Cytology - PAP - Follow up with GYN in 1 yr or prn  2. Thyroid goiter (previous hx) - present today - US done 1-2 years ago, may need  rpt imaging - no recent TSH - refer to PCP for evaluation/mngt  3. History of HSV - stop daily suppression - Rx prn outbreak   Julianne Handler, [place] 10/02/2017 12:12 PM

## 2017-10-06 LAB — CYTOLOGY - PAP
DIAGNOSIS: NEGATIVE
HPV: NOT DETECTED

## 2017-10-09 ENCOUNTER — Ambulatory Visit (INDEPENDENT_AMBULATORY_CARE_PROVIDER_SITE_OTHER): Payer: BLUE CROSS/BLUE SHIELD | Admitting: Physician Assistant

## 2017-10-09 ENCOUNTER — Encounter: Payer: Self-pay | Admitting: Physician Assistant

## 2017-10-09 ENCOUNTER — Ambulatory Visit (INDEPENDENT_AMBULATORY_CARE_PROVIDER_SITE_OTHER): Payer: BLUE CROSS/BLUE SHIELD

## 2017-10-09 VITALS — BP 104/62 | HR 78 | Wt 145.5 lb

## 2017-10-09 DIAGNOSIS — Z1231 Encounter for screening mammogram for malignant neoplasm of breast: Secondary | ICD-10-CM | POA: Diagnosis not present

## 2017-10-09 DIAGNOSIS — Z0000 Encounter for general adult medical examination without abnormal findings: Secondary | ICD-10-CM

## 2017-10-09 DIAGNOSIS — R5383 Other fatigue: Secondary | ICD-10-CM | POA: Diagnosis not present

## 2017-10-09 DIAGNOSIS — F5101 Primary insomnia: Secondary | ICD-10-CM

## 2017-10-09 DIAGNOSIS — K219 Gastro-esophageal reflux disease without esophagitis: Secondary | ICD-10-CM | POA: Diagnosis not present

## 2017-10-09 DIAGNOSIS — E049 Nontoxic goiter, unspecified: Secondary | ICD-10-CM

## 2017-10-09 DIAGNOSIS — Z131 Encounter for screening for diabetes mellitus: Secondary | ICD-10-CM

## 2017-10-09 DIAGNOSIS — Z01419 Encounter for gynecological examination (general) (routine) without abnormal findings: Secondary | ICD-10-CM

## 2017-10-09 DIAGNOSIS — R1310 Dysphagia, unspecified: Secondary | ICD-10-CM | POA: Diagnosis not present

## 2017-10-09 DIAGNOSIS — R600 Localized edema: Secondary | ICD-10-CM | POA: Diagnosis not present

## 2017-10-09 DIAGNOSIS — Z13220 Encounter for screening for lipoid disorders: Secondary | ICD-10-CM

## 2017-10-09 MED ORDER — OMEPRAZOLE 40 MG PO CPDR: DELAYED_RELEASE_CAPSULE | ORAL | 1 refills | Status: DC

## 2017-10-09 MED ORDER — HYDROCHLOROTHIAZIDE 12.5 MG PO TABS: ORAL_TABLET | ORAL | 1 refills | Status: DC

## 2017-10-09 NOTE — Progress Notes (Signed)
Subjective:    Patient ID: Barbara Bauer, female    DOB: February 26, 1976, 42 y.o.   MRN: [id number]  HPI Patient is a 42 yo female with a pmhx of GERD, thyroid goiter, and bilateral LE edema presenting to clinic for a recheck of her thyroid. She was told approximately 8 years ago by a provider that her thyroid felt enlarged. She had [place] and thyroid function tests done 2 years ago and there were no abnormal findings. She admits to some hot flashes and night sweats. She had a tubal ligation last year. She attributes these symptoms possibly to perimenopause and a hot sleeping environment. Her bedroom has poor air conditioning. She denies palpitations, chest pain, SHOB, unintentional weight loss, or changes in skin/hair/nails.   She is on hctz as needed for bilateral lower extremity edema. She still experiences some intermittent edema but is overall pleased with hctz helping manage her symptoms.  Sleep - She has had some difficulty sleeping, waking up several times a night. She thinks this could be related to a warm sleeping environment and her partner possibly waking her up.    Review of Systems  All other systems reviewed and are negative.      Objective:   Physical Exam  Constitutional: She is oriented to person, place, and time. She appears well-developed and well-nourished. No distress.  HENT:  Head: Normocephalic and atraumatic.  Neck: Normal range of motion. Neck supple. No tracheal deviation present. Thyromegaly (diffusely enlarged. No nodules palpated) present.  Cardiovascular: Normal rate, regular rhythm and normal heart sounds. Exam reveals no gallop and no friction rub.  No murmur heard. Pulmonary/Chest: Effort normal and breath sounds normal.  Neurological: She is alert and oriented to person, place, and time.  Skin: Skin is warm and dry. She is not diaphoretic.  Psychiatric: She has a normal mood and affect. Her behavior is normal.  Vitals reviewed.    .. Active Ambulatory  Problems    Diagnosis Date Noted  . General counselling and advice on contraception 02/16/2013  . Thyroid goiter 03/16/2013  . Hx of abnormal Pap smear 03/16/2013  . GERD (gastroesophageal reflux disease) 03/16/2013  . HPV test positive 04/13/2013  . Cervical intraepithelial neoplasia grade 1 04/18/2014  . Abnormal Papanicolaou smear of cervix with positive human papilloma virus (HPV) test 04/18/2014  . Eosinophilia 09/18/2014  . Abnormal weight gain 09/05/2015  . Night sweats 09/05/2015  . Bilateral edema of lower extremity 09/05/2015  . Adjustment insomnia 07/30/2016  . Adjustment disorder with mixed anxiety and depressed mood 07/30/2016  . History of herpes genitalis 10/02/2017  . No energy 10/09/2017  . Dysphagia 10/09/2017   Resolved Ambulatory Problems    Diagnosis Date Noted  . No Resolved Ambulatory Problems   Past Medical History:  Diagnosis Date  . Edema leg   . Eosinophilia   . Genital warts   . GERD (gastroesophageal reflux disease)   . Goiter   . HPV in female   . Hyperlipidemia   . Thyroid goiter    .Marland Kitchen Family History  Problem Relation Age of Onset  . Other Father        Pityriasis Ruba Pilaris  . Breast cancer Maternal Grandmother   . Heart attack Maternal Grandfather   . Colon cancer Neg Hx   . Esophageal cancer Neg Hx   . Stomach cancer Neg Hx   . Pancreatic cancer Neg Hx   . Colon polyps Neg Hx   . Diabetes Neg Hx   . Kidney  disease Neg Hx   . Liver disease Neg Hx   . Rectal cancer Neg Hx          Assessment & Plan:  Marland KitchenMarland KitchenNatoshia was seen today for follow-up.  Diagnoses and all orders for this visit:  Gastroesophageal reflux disease, esophagitis presence not specified -     omeprazole (PRILOSEC) 40 MG capsule; Take 40 mgs by mouth once daily as needed for acid reflux  Bilateral edema of lower extremity -     hydrochlorothiazide (HYDRODIURIL) 12.5 MG tablet; TAKE 1-2 TABLETS BY MOUTH AS NEEDED DAILY FOR LOWER EXTREMITY  SWELLING  Dysphagia -     omeprazole (PRILOSEC) 40 MG capsule; Take 40 mgs by mouth once daily as needed for acid reflux  Enlarged thyroid -     Thyroid Panel With TSH  Screening for lipid disorders -     Lipid Panel w/reflex Direct LDL  Screening for diabetes mellitus -     COMPLETE METABOLIC PANEL WITH GFR  Preventative health care -     CBC  No energy -     B12 and Folate Panel -     VITAMIN D 25 Hydroxy (Vit-D Deficiency, Fractures)  Primary insomnia  Bilateral LE edema - continue management with Hctz  Sleep - discussed sleep hygiene and natural remedies such as melatonin, meditation, and cool sleep environment. She will call or return if symptoms worsen.  Thyroid goiter - Will continue to follow. Labs pending.

## 2017-10-11 ENCOUNTER — Encounter: Payer: Self-pay | Admitting: Physician Assistant

## 2017-10-21 DIAGNOSIS — Z13220 Encounter for screening for lipoid disorders: Secondary | ICD-10-CM | POA: Diagnosis not present

## 2017-10-21 DIAGNOSIS — Z1329 Encounter for screening for other suspected endocrine disorder: Secondary | ICD-10-CM | POA: Diagnosis not present

## 2017-10-21 DIAGNOSIS — R61 Generalized hyperhidrosis: Secondary | ICD-10-CM | POA: Diagnosis not present

## 2017-10-21 DIAGNOSIS — Z131 Encounter for screening for diabetes mellitus: Secondary | ICD-10-CM | POA: Diagnosis not present

## 2017-10-21 DIAGNOSIS — Z0000 Encounter for general adult medical examination without abnormal findings: Secondary | ICD-10-CM | POA: Diagnosis not present

## 2017-10-21 DIAGNOSIS — E049 Nontoxic goiter, unspecified: Secondary | ICD-10-CM | POA: Diagnosis not present

## 2017-10-21 DIAGNOSIS — R5383 Other fatigue: Secondary | ICD-10-CM | POA: Diagnosis not present

## 2017-10-21 DIAGNOSIS — R635 Abnormal weight gain: Secondary | ICD-10-CM | POA: Diagnosis not present

## 2017-10-21 LAB — COMPLETE METABOLIC PANEL WITH GFR
AG Ratio: 1.4 (calc) (ref 1.0–2.5)
ALBUMIN MSPROF: 4.2 g/dL (ref 3.6–5.1)
ALT: 20 U/L (ref 6–29)
AST: 22 U/L (ref 10–30)
Alkaline phosphatase (APISO): 62 U/L (ref 33–115)
BUN: 14 mg/dL (ref 7–25)
CALCIUM: 9.4 mg/dL (ref 8.6–10.2)
CO2: 26 mmol/L (ref 20–32)
CREATININE: 0.89 mg/dL (ref 0.50–1.10)
Chloride: 105 mmol/L (ref 98–110)
GFR, EST AFRICAN AMERICAN: 93 mL/min/{1.73_m2} (ref >=60)
GFR, EST NON AFRICAN AMERICAN: 81 mL/min/{1.73_m2} (ref >=60)
GLOBULIN: 2.9 g/dL (ref 1.9–3.7)
Glucose, Bld: 95 mg/dL (ref 65–99)
POTASSIUM: 4.3 mmol/L (ref 3.5–5.3)
SODIUM: 138 mmol/L (ref 135–146)
Total Bilirubin: 0.8 mg/dL (ref 0.2–1.2)
Total Protein: 7.1 g/dL (ref 6.1–8.1)

## 2017-10-21 LAB — CBC
HEMATOCRIT: 40.6 % (ref 35.0–45.0)
Hemoglobin: 13.9 g/dL (ref 11.7–15.5)
MCH: 29.6 pg (ref 27.0–33.0)
MCHC: 34.2 g/dL (ref 32.0–36.0)
MCV: 86.4 fL (ref 80.0–100.0)
MPV: 12.4 fL (ref 7.5–12.5)
Platelets: 352 10*3/uL (ref 140–400)
RBC: 4.7 10*6/uL (ref 3.80–5.10)
RDW: 11.9 % (ref 11.0–15.0)
WBC: 6.9 10*3/uL (ref 3.8–10.8)

## 2017-10-21 LAB — LIPID PANEL W/REFLEX DIRECT LDL
CHOL/HDL RATIO: 3.4 (calc) (ref <5.0)
CHOLESTEROL: 216 mg/dL — AB (ref <200)
HDL: 63 mg/dL (ref >50)
LDL Cholesterol (Calc): 136 mg/dL (calc) — ABNORMAL HIGH
Non-HDL Cholesterol (Calc): 153 mg/dL (calc) — ABNORMAL HIGH (ref <130)
Triglycerides: 75 mg/dL (ref <150)

## 2017-10-21 LAB — THYROID PANEL WITH TSH
Free Thyroxine Index: 2.1 (ref 1.4–3.8)
T3 UPTAKE: 31 % (ref 22–35)
T4 TOTAL: 6.7 ug/dL (ref 5.1–11.9)
TSH: 1.29 mIU/L

## 2017-10-22 LAB — B12 AND FOLATE PANEL
Folate: 17.7 ng/mL
Vitamin B-12: 797 pg/mL (ref 200–1100)

## 2017-10-22 LAB — VITAMIN D 25 HYDROXY (VIT D DEFICIENCY, FRACTURES): Vit D, 25-Hydroxy: 41 ng/mL (ref 30–100)

## 2017-10-22 NOTE — Progress Notes (Signed)
Call pt: thyroid looks great. b12 looks great.  Vitamin d looks good. LDL increased from 2 years ago. Does not need medication but need to monitor this. HDL still looks great.

## 2018-03-16 DIAGNOSIS — D485 Neoplasm of uncertain behavior of skin: Secondary | ICD-10-CM | POA: Diagnosis not present

## 2018-03-16 DIAGNOSIS — D225 Melanocytic nevi of trunk: Secondary | ICD-10-CM | POA: Diagnosis not present

## 2018-03-16 DIAGNOSIS — L814 Other melanin hyperpigmentation: Secondary | ICD-10-CM | POA: Diagnosis not present

## 2018-04-22 DIAGNOSIS — N390 Urinary tract infection, site not specified: Secondary | ICD-10-CM | POA: Diagnosis not present

## 2018-08-31 ENCOUNTER — Ambulatory Visit (INDEPENDENT_AMBULATORY_CARE_PROVIDER_SITE_OTHER): Payer: BLUE CROSS/BLUE SHIELD | Admitting: Physician Assistant

## 2018-08-31 ENCOUNTER — Telehealth: Payer: Self-pay | Admitting: Physician Assistant

## 2018-08-31 ENCOUNTER — Encounter: Payer: Self-pay | Admitting: Physician Assistant

## 2018-08-31 VITALS — Ht 64.0 in | Wt 145.0 lb

## 2018-08-31 DIAGNOSIS — N926 Irregular menstruation, unspecified: Secondary | ICD-10-CM | POA: Diagnosis not present

## 2018-08-31 DIAGNOSIS — R232 Flushing: Secondary | ICD-10-CM

## 2018-08-31 DIAGNOSIS — R7989 Other specified abnormal findings of blood chemistry: Secondary | ICD-10-CM | POA: Diagnosis not present

## 2018-08-31 DIAGNOSIS — N644 Mastodynia: Secondary | ICD-10-CM | POA: Insufficient documentation

## 2018-08-31 NOTE — Telephone Encounter (Signed)
Update - Pt is scheduled for today

## 2018-08-31 NOTE — Telephone Encounter (Signed)
Pt called clinic stating she is 41 days late for her menstrual cycle. Reports she did have a tubal ligation 2 years ago. States she took 2 home pregnancy test already, both negative. Does have a virtual visit with OB/GYN for Monday (their first available).   Questions if there is a lab that can be done to r/o pregnancy (worried about ectopic based on her tubal) vs premenopausal. Denies abdominal pain, does report some breast tenderness.   Willing to come in to be seen, routing to PCP.

## 2018-08-31 NOTE — Progress Notes (Deleted)
Had tubal ligation 2 years ago - change in menstrual cycle since that time, but changed within last week. 42 days since last period. Breast soreness. Some weight gain. Has had two negative pregnancy tests. Wants to discuss.

## 2018-08-31 NOTE — Progress Notes (Signed)
Patient ID: Barbara Bauer, female   DOB: 08-30-1975, 43 y.o.   MRN: [id number] .Marland KitchenVirtual Visit via Video Note  I connected with Barbara Bauer on 08/31/18 at 11:30 AM EDT by a video enabled telemedicine application and verified that I am speaking with the correct person using two identifiers.  Location: Patient: home Provider: clinic   I discussed the limitations of evaluation and management by telemedicine and the availability of in person appointments. The patient expressed understanding and agreed to proceed.  History of Present Illness: Patient is a 43 year old female with GERD and thyroid goiter who calls into the clinic to discuss missed.  And breast soreness.  Patient had a bilateral tubal ligation approximately 2 years ago and has been off birth control since then.  The longest her cycle has gone would be 31 days.  This cycle has lasted 42 days.  She denies any abdominal pain but has had some abdominal cramping.  She has taken 2 home pregnancy test that were negative.  She does report some bilateral breast soreness.  She has had some intermittent hot flashes at night.  She is struggled with some sleep changes over the past year.  She goes to sleep fine but tends to wake up at least twice every night.  She usually is able to go back to sleep after 5 to 10 minutes. Pt is concerned the most about pregnancy.   .. Active Ambulatory Problems    Diagnosis Date Noted  . General counselling and advice on contraception 02/16/2013  . Thyroid goiter 03/16/2013  . Hx of abnormal Pap smear 03/16/2013  . GERD (gastroesophageal reflux disease) 03/16/2013  . HPV test positive 04/13/2013  . Cervical intraepithelial neoplasia grade 1 04/18/2014  . Abnormal Papanicolaou smear of cervix with positive human papilloma virus (HPV) test 04/18/2014  . Eosinophilia 09/18/2014  . Abnormal weight gain 09/05/2015  . Night sweats 09/05/2015  . Bilateral edema of lower extremity 09/05/2015  . Adjustment  insomnia 07/30/2016  . Adjustment disorder with mixed anxiety and depressed mood 07/30/2016  . History of herpes genitalis 10/02/2017  . No energy 10/09/2017  . Dysphagia 10/09/2017   Resolved Ambulatory Problems    Diagnosis Date Noted  . No Resolved Ambulatory Problems   Past Medical History:  Diagnosis Date  . Edema leg   . Genital warts   . Goiter   . HPV in female   . Hyperlipidemia    Reviewed med, allergy, problem list.    Observations/Objective: No acute distress. Normal appearance.  Normal breathing.  Normal mood.   .. Today's Vitals   08/31/18 1102  Weight: 145 lb (65.8 kg)  Height: 5\' 4"  (1.626 m)   Body mass index is 24.89 kg/m.    Assessment and Plan: .Deann was seen today for menstrual problem.  Diagnoses and all orders for this visit:  Missed period -     TSH -     FSH/LH -     B-HCG Quant -     COMPLETE METABOLIC PANEL WITH GFR -     Estradiol  Soreness breast -     TSH -     FSH/LH -     B-HCG Quant -     COMPLETE METABOLIC PANEL WITH GFR -     Estradiol  Hot flashes -     TSH -     FSH/LH -     B-HCG Quant -     COMPLETE METABOLIC PANEL WITH GFR -     Estradiol  Unclear etiology of longer than normal cycle.  Certainly she could be entering perimenopause.  She would be a little early.  She is sexually active but has had a bilateral tubal ligation about 2 years ago.  We will confirm in labs that she is not pregnant.  We will also check her hormones to look for signs that she is in perimenopause.  Spent some time discussing menopausal symptoms and treatments.  Can discuss at a later date if she feels like she would like to start any of these.  Discussed more in depth how sleep can be affected by hormonal changes.  She is taking melatonin.  Strongly discussed a good sleep routine.  Discussed medicine such as Belsomra if she would like to consider medical intervention.  Pt is up to date on her mammogram. Follow Up Instructions:     I discussed the assessment and treatment plan with the patient. The patient was provided an opportunity to ask questions and all were answered. The patient agreed with the plan and demonstrated an understanding of the instructions.   The patient was advised to call back or seek an in-person evaluation if the symptoms worsen or if the condition fails to improve as anticipated.  I provided 25 minutes of non-face-to-face time during this encounter.   Iran Planas, PA-C

## 2018-09-01 ENCOUNTER — Encounter: Payer: Self-pay | Admitting: Physician Assistant

## 2018-09-01 LAB — COMPLETE METABOLIC PANEL WITH GFR
AG Ratio: 1.4 (calc) (ref 1.0–2.5)
ALT: 14 U/L (ref 6–29)
AST: 20 U/L (ref 10–30)
Albumin: 4.4 g/dL (ref 3.6–5.1)
Alkaline phosphatase (APISO): 62 U/L (ref 31–125)
BUN: 11 mg/dL (ref 7–25)
CO2: 27 mmol/L (ref 20–32)
Calcium: 10.4 mg/dL — ABNORMAL HIGH (ref 8.6–10.2)
Chloride: 104 mmol/L (ref 98–110)
Creat: 0.87 mg/dL (ref 0.50–1.10)
GFR, Est African American: 95 mL/min/{1.73_m2} (ref >=60)
GFR, Est Non African American: 82 mL/min/{1.73_m2} (ref >=60)
Globulin: 3.1 g/dL (calc) (ref 1.9–3.7)
Glucose, Bld: 102 mg/dL — ABNORMAL HIGH (ref 65–99)
Potassium: 3.7 mmol/L (ref 3.5–5.3)
Sodium: 139 mmol/L (ref 135–146)
Total Bilirubin: 0.7 mg/dL (ref 0.2–1.2)
Total Protein: 7.5 g/dL (ref 6.1–8.1)

## 2018-09-01 LAB — FSH/LH
FSH: 2.2 m[IU]/mL
LH: 1.9 m[IU]/mL

## 2018-09-01 LAB — TSH: TSH: 1.09 mIU/L

## 2018-09-01 LAB — ESTRADIOL: Estradiol: 175 pg/mL

## 2018-09-01 LAB — HCG, QUANTITATIVE, PREGNANCY: HCG, Total, QN: <3 m[IU]/mL

## 2018-09-01 NOTE — Progress Notes (Signed)
Yes sometimes if vitamin D is low can cause calcium in the blood to not have a transport to get into bones. Start D3 1000 units. We could recheck calcium and vitamin d in a few months.

## 2018-09-01 NOTE — Progress Notes (Signed)
Call pt: thyroid looks great. You are NOT pregnant. Hormone levels in normal range you are not even trending towards menopause. Your calcium is up just a hair. You vitamin D could be low. Do you take calcium and or vitamin D?   No worrisome reason why you have not had cycle.

## 2018-09-04 ENCOUNTER — Other Ambulatory Visit: Payer: Self-pay | Admitting: Physician Assistant

## 2018-09-04 DIAGNOSIS — R600 Localized edema: Secondary | ICD-10-CM

## 2018-09-06 ENCOUNTER — Ambulatory Visit: Payer: BLUE CROSS/BLUE SHIELD | Admitting: Obstetrics & Gynecology

## 2018-10-08 DIAGNOSIS — H5213 Myopia, bilateral: Secondary | ICD-10-CM | POA: Diagnosis not present

## 2018-12-02 DIAGNOSIS — F418 Other specified anxiety disorders: Secondary | ICD-10-CM | POA: Diagnosis not present

## 2018-12-02 DIAGNOSIS — Z8639 Personal history of other endocrine, nutritional and metabolic disease: Secondary | ICD-10-CM | POA: Diagnosis not present

## 2018-12-02 DIAGNOSIS — K219 Gastro-esophageal reflux disease without esophagitis: Secondary | ICD-10-CM | POA: Diagnosis not present

## 2018-12-02 DIAGNOSIS — R5383 Other fatigue: Secondary | ICD-10-CM | POA: Diagnosis not present

## 2018-12-02 DIAGNOSIS — R635 Abnormal weight gain: Secondary | ICD-10-CM | POA: Diagnosis not present

## 2018-12-22 DIAGNOSIS — F341 Dysthymic disorder: Secondary | ICD-10-CM | POA: Diagnosis not present

## 2019-01-05 DIAGNOSIS — F341 Dysthymic disorder: Secondary | ICD-10-CM | POA: Diagnosis not present

## 2019-01-26 ENCOUNTER — Other Ambulatory Visit: Payer: Self-pay | Admitting: Physician Assistant

## 2019-01-26 DIAGNOSIS — R600 Localized edema: Secondary | ICD-10-CM

## 2019-02-02 DIAGNOSIS — F341 Dysthymic disorder: Secondary | ICD-10-CM | POA: Diagnosis not present

## 2019-02-16 DIAGNOSIS — F341 Dysthymic disorder: Secondary | ICD-10-CM | POA: Diagnosis not present

## 2019-02-19 ENCOUNTER — Other Ambulatory Visit: Payer: Self-pay | Admitting: Physician Assistant

## 2019-02-19 DIAGNOSIS — R600 Localized edema: Secondary | ICD-10-CM

## 2019-02-24 DIAGNOSIS — L814 Other melanin hyperpigmentation: Secondary | ICD-10-CM | POA: Diagnosis not present

## 2019-02-24 DIAGNOSIS — D225 Melanocytic nevi of trunk: Secondary | ICD-10-CM | POA: Diagnosis not present

## 2019-02-24 DIAGNOSIS — L821 Other seborrheic keratosis: Secondary | ICD-10-CM | POA: Diagnosis not present

## 2019-03-08 ENCOUNTER — Other Ambulatory Visit: Payer: Self-pay | Admitting: Physician Assistant

## 2019-03-08 DIAGNOSIS — R600 Localized edema: Secondary | ICD-10-CM

## 2019-04-05 DIAGNOSIS — D485 Neoplasm of uncertain behavior of skin: Secondary | ICD-10-CM | POA: Diagnosis not present

## 2019-04-05 DIAGNOSIS — D2239 Melanocytic nevi of other parts of face: Secondary | ICD-10-CM | POA: Diagnosis not present

## 2019-04-28 ENCOUNTER — Encounter: Payer: Self-pay | Admitting: Physician Assistant

## 2019-04-28 ENCOUNTER — Ambulatory Visit (INDEPENDENT_AMBULATORY_CARE_PROVIDER_SITE_OTHER): Payer: BC Managed Care – PPO | Admitting: Physician Assistant

## 2019-04-28 VITALS — Ht 64.0 in | Wt 145.0 lb

## 2019-04-28 DIAGNOSIS — R4589 Other symptoms and signs involving emotional state: Secondary | ICD-10-CM

## 2019-04-28 DIAGNOSIS — R4586 Emotional lability: Secondary | ICD-10-CM | POA: Diagnosis not present

## 2019-04-28 DIAGNOSIS — R454 Irritability and anger: Secondary | ICD-10-CM | POA: Diagnosis not present

## 2019-04-28 DIAGNOSIS — R600 Localized edema: Secondary | ICD-10-CM

## 2019-04-28 MED ORDER — HYDROCHLOROTHIAZIDE 12.5 MG PO TABS: ORAL_TABLET | ORAL | 0 refills | Status: DC

## 2019-04-28 MED ORDER — BUPROPION HCL ER (XL) 150 MG PO TB24: 150.0000 mg | ORAL_TABLET | ORAL | 2 refills | Status: DC

## 2019-04-28 NOTE — Progress Notes (Signed)
Patient ID: Barbara Bauer, female   DOB: 11/26/1975, 43 y.o.   MRN: [id number] .Marland KitchenVirtual Visit via Video Note  I connected with Barbara Bauer on 04/28/19 at  2:00 PM EST by a video enabled telemedicine application and verified that I am speaking with the correct person using two identifiers.  Location: Patient: work Provider: clinic   I discussed the limitations of evaluation and management by telemedicine and the availability of in person appointments. The patient expressed understanding and agreed to proceed.  History of Present Illness: Pt is a 43 yo female with history of intermittent anxiety and depressed mood who calls into the clinic to discuss mood. She has had a lot of events happen this year that have been "hard". 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 This year she has been working virtually, lost 2 dogs, therapist retired. She just feels "sad". She is trying to keep exercising. She did get a new puppy. No SI/HC. She is ready to feel better. She tried lexapro when she was younger but she does not remember it helping that much. She would like to try wellbutrin.   Occasionally uses HCTZ for leg swelling needs refill.   .. Active Ambulatory Problems    Diagnosis Date Noted  . General counselling and advice on contraception 02/16/2013  . Thyroid goiter 03/16/2013  . Hx of abnormal Pap smear 03/16/2013  . GERD (gastroesophageal reflux disease) 03/16/2013  . HPV test positive 04/13/2013  . Cervical intraepithelial neoplasia grade 1 04/18/2014  . Abnormal Papanicolaou smear of cervix with positive human papilloma virus (HPV) test 04/18/2014  . Eosinophilia 09/18/2014  . Abnormal weight gain 09/05/2015  . Night sweats 09/05/2015  . Bilateral edema of lower extremity 09/05/2015  . Adjustment insomnia 07/30/2016  . Adjustment disorder with mixed anxiety and depressed mood 07/30/2016  . History of herpes genitalis 10/02/2017  . No energy 10/09/2017  .  Dysphagia 10/09/2017  . Missed period 08/31/2018  . Soreness breast 08/31/2018  . Hot flashes 08/31/2018  . Hypercalcemia 09/01/2018  . Irritable 04/28/2019  . Mood changes 04/28/2019  . Depressed mood 04/28/2019   Resolved Ambulatory Problems    Diagnosis Date Noted  . No Resolved Ambulatory Problems   Past Medical History:  Diagnosis Date  . Edema leg   . Genital warts   . Goiter   . HPV in female   . Hyperlipidemia    Reviewed med, allergy, problem list.     Observations/Objective: No acute distress. Normal mood and appearance.   .. Today's Vitals   04/28/19 1231  Weight: 145 lb (65.8 kg)  Height: 5\' 4"  (1.626 m)   Body mass index is 24.89 kg/m.   .. Depression screen [facility] 2/9 04/28/2019 10/09/2017 07/30/2016  Decreased Interest 0 0 1  Down, Depressed, Hopeless 2 0 1  PHQ - 2 Score 2 0 2  Altered sleeping 0 2 -  Tired, decreased energy 0 1 -  Change in appetite 0 0 -  Feeling bad or failure about yourself  0 0 -  Trouble concentrating 1 0 -  Moving slowly or fidgety/restless 0 0 -  Suicidal thoughts 0 0 -  PHQ-9 Score 3 3 -  Difficult doing work/chores Somewhat difficult Not difficult at all -   .. GAD 7 : Generalized Anxiety Score 04/28/2019 10/09/2017  Nervous, Anxious, on Edge 1 0  Control/stop worrying 1 0  Worry too much - different things 1 0  Trouble relaxing 0 1  Restless 0 0  Easily annoyed or irritable 1 1  Afraid - awful might happen 0 0  Total GAD 7 Score 4 2  Anxiety Difficulty Somewhat difficult Not difficult at all     Assessment and Plan: Marland KitchenMarland KitchenDiagnoses and all orders for this visit:  Irritable -     buPROPion (WELLBUTRIN XL) 150 MG 24 hr tablet; Take 1 tablet (150 mg total) by mouth every morning.  Bilateral edema of lower extremity -     hydrochlorothiazide (HYDRODIURIL) 12.5 MG tablet; Take 1 -2 tablets as needed for lower extremity swelling.  Mood changes -     buPROPion (WELLBUTRIN XL) 150 MG 24 hr tablet; Take 1 tablet (150  mg total) by mouth every morning.  Depressed mood -     buPROPion (WELLBUTRIN XL) 150 MG 24 hr tablet; Take 1 tablet (150 mg total) by mouth every morning.   Added wellbutrin. Continue with counseling. Continue with exercise. Follow up in 4 weeks. Discussed side effects.    Follow Up Instructions:    I discussed the assessment and treatment plan with the patient. The patient was provided an opportunity to ask questions and all were answered. The patient agreed with the plan and demonstrated an understanding of the instructions.   The patient was advised to call back or seek an in-person evaluation if the symptoms worsen or if the condition fails to improve as anticipated.    Iran Planas, PA-C

## 2019-04-28 NOTE — Progress Notes (Signed)
Patient is having some abnormal mood swings in the last 3 months. Patient wants to talk about taking Wellbutrin. She wants to discuss a plan with you. She reports that Covid has made some stress worse on her. She will go into more detail with you at the visit.   She is going to ask about placing a mammogram order in for her to get next year.    I have updated the depression screening.

## 2019-05-21 ENCOUNTER — Other Ambulatory Visit: Payer: Self-pay | Admitting: Physician Assistant

## 2019-05-21 DIAGNOSIS — R4586 Emotional lability: Secondary | ICD-10-CM

## 2019-05-21 DIAGNOSIS — R454 Irritability and anger: Secondary | ICD-10-CM

## 2019-05-21 DIAGNOSIS — R4589 Other symptoms and signs involving emotional state: Secondary | ICD-10-CM

## 2019-08-21 ENCOUNTER — Other Ambulatory Visit: Payer: Self-pay | Admitting: Physician Assistant

## 2019-08-21 DIAGNOSIS — R4589 Other symptoms and signs involving emotional state: Secondary | ICD-10-CM

## 2019-08-21 DIAGNOSIS — R4586 Emotional lability: Secondary | ICD-10-CM

## 2019-08-21 DIAGNOSIS — R454 Irritability and anger: Secondary | ICD-10-CM

## 2019-09-02 ENCOUNTER — Other Ambulatory Visit (HOSPITAL_COMMUNITY)
Admission: RE | Admit: 2019-09-02 | Discharge: 2019-09-02 | Disposition: A | Discharge status: Home / Self Care | Payer: BC Managed Care – PPO | Source: Ambulatory Visit | Attending: Certified Nurse Midwife | Admitting: Certified Nurse Midwife

## 2019-09-02 ENCOUNTER — Encounter: Payer: Self-pay | Admitting: Certified Nurse Midwife

## 2019-09-02 ENCOUNTER — Other Ambulatory Visit: Payer: Self-pay

## 2019-09-02 ENCOUNTER — Ambulatory Visit (INDEPENDENT_AMBULATORY_CARE_PROVIDER_SITE_OTHER): Payer: BC Managed Care – PPO | Admitting: Certified Nurse Midwife

## 2019-09-02 VITALS — BP 122/79 | HR 88 | Ht 64.0 in | Wt 133.0 lb

## 2019-09-02 DIAGNOSIS — R6882 Decreased libido: Secondary | ICD-10-CM | POA: Diagnosis not present

## 2019-09-02 DIAGNOSIS — Z01419 Encounter for gynecological examination (general) (routine) without abnormal findings: Secondary | ICD-10-CM | POA: Diagnosis not present

## 2019-09-02 DIAGNOSIS — N920 Excessive and frequent menstruation with regular cycle: Secondary | ICD-10-CM | POA: Diagnosis not present

## 2019-09-02 NOTE — Progress Notes (Signed)
Last pap- 10/02/17- negative Last mammogram- 10/09/17- normal

## 2019-09-02 NOTE — Progress Notes (Signed)
Gynecology Annual Exam   History of Present Illness: Barbara Bauer is a 44 y.o. married female presenting for an annual exam. She reports irregular menstrual cycles recently. Has occasional night sweats. She also reports decreased libido over the last 6 mos. She is sexually active. She denies dyspareunia but feels "small cuts" vaginally after IC. Recently started Wellbutrin about 4 mos ago. She does perform self breast exams. There is notable family history of breast or ovarian cancer in her family.   Past Medical History:  Past Medical History:  Diagnosis Date  . Edema leg   . Eosinophilia   . Genital warts   . GERD (gastroesophageal reflux disease)   . Goiter    MD watching - no treatment  . HPV in female   . Hyperlipidemia    diet controlled  . Thyroid goiter    Past Surgical History:  Past Surgical History:  Procedure Laterality Date  . ESOPHAGOGASTRODUODENOSCOPY ENDOSCOPY    . EYE SURGERY Bilateral    lasik   . LAPAROSCOPIC TUBAL LIGATION Bilateral 10/03/2016   Procedure: LAPAROSCOPIC TUBAL LIGATION;  Surgeon: Guss Bunde, MD;  Location: [facility];  Service: Gynecology;  Laterality: Bilateral;  . UPPER GASTROINTESTINAL ENDOSCOPY    . WISDOM TOOTH EXTRACTION     Gynecologic History:  LMP: Patient's last menstrual period was 08/19/2019. Menarche: age 54 or 43 Average Interval: regular Heavy Menses: no Clots: no Intermenstrual Bleeding: no Postcoital Bleeding: no Dysmenorrhea: no Contraception: tubal ligation Last Pap: completed on 2019; result was: NIL and HR HPV negative  Mammogram: last completed on 2019, result was neg.  Obstetric History: [id number]  Family History:  Family History  Problem Relation Age of Onset  . Other Father        Pityriasis Ruba Pilaris  . Breast cancer Maternal Grandmother   . Heart attack Maternal Grandfather   . Colon cancer Neg Hx   . Esophageal cancer Neg Hx   . Stomach cancer Neg Hx   . Pancreatic cancer Neg Hx   . Colon polyps  Neg Hx   . Diabetes Neg Hx   . Kidney disease Neg Hx   . Liver disease Neg Hx   . Rectal cancer Neg Hx     Social History:  Social History   Socioeconomic History  . Marital status: Single    Spouse name: Not on file  . Number of children: Not on file  . Years of education: Not on file  . Highest education level: Not on file  Occupational History  . Occupation: professor  Tobacco Use  . Smoking status: Never Smoker  . Smokeless tobacco: Never Used  Substance and Sexual Activity  . Alcohol use: Yes    Alcohol/week: 0.0 standard drinks    Comment: minimal  . Drug use: No  . Sexual activity: Yes    Partners: Male    Comment: NuvaRing  Other Topics Concern  . Not on file  Social History Narrative  . Not on file   Social Determinants of Health   Financial Resource Strain:   . Difficulty of Paying Living Expenses:   Food Insecurity:   . Worried About Charity fundraiser in the Last Year:   . Arboriculturist in the Last Year:   Transportation Needs:   . Film/video editor (Medical):   Marland Kitchen Lack of Transportation (Non-Medical):   Physical Activity:   . Days of Exercise per Week:   . Minutes of Exercise per Session:   Stress:   .  Feeling of Stress :   Social Connections:   . Frequency of Communication with Friends and Family:   . Frequency of Social Gatherings with Friends and Family:   . Attends Religious Services:   . Active Member of Clubs or Organizations:   . Attends Archivist Meetings:   Marland Kitchen Marital Status:   Intimate Partner Violence:   . Fear of Current or Ex-Partner:   . Emotionally Abused:   Marland Kitchen Physically Abused:   . Sexually Abused:     Allergies:  Allergies  Allergen Reactions  . Penicillins Rash    Has patient had a PCN reaction causing immediate rash, facial/tongue/throat swelling, SOB or lightheadedness with hypotension: Yes Has patient had a PCN reaction causing severe rash involving mucus membranes or skin necrosis: No Has patient  had a PCN reaction that required hospitalization: No Has patient had a PCN reaction occurring within the last 10 years: No If all of the above answers are "NO", then may proceed with Cephalosporin use.     Medications: Prior to Admission medications   Medication Sig Start Date End Date Taking? Authorizing Provider  buPROPion (WELLBUTRIN XL) 150 MG 24 hr tablet Take 1 tablet (150 mg total) by mouth daily. Needs appt 08/22/19  Yes Breeback, Jade L, PA-C  cholecalciferol (VITAMIN D3) 25 MCG (1000 UT) tablet Take 1,000 Units by mouth daily.   Yes [provider]  hydrochlorothiazide (HYDRODIURIL) 12.5 MG tablet Take 1 -2 tablets as needed for lower extremity swelling. 04/28/19  Yes Breeback, Jade L, PA-C  ibuprofen (ADVIL,MOTRIN) 600 MG tablet Take 1 tablet (600 mg total) by mouth every 6 (six) hours as needed. 10/03/16  Yes Guss Bunde, MD  Multiple Vitamin (MULTIVITAMIN) tablet Take 1 tablet by mouth daily.   Yes [provider]  omeprazole (PRILOSEC) 40 MG capsule Take 40 mgs by mouth once daily as needed for acid reflux 10/09/17  Yes Breeback, Jade L, PA-C  Probiotic Product (PROBIOTIC DAILY PO) Take 1 capsule by mouth daily.    Yes [provider]    Review of Systems: negative except noted in HPI  Physical Exam Vitals: BP 122/79   Pulse 88   Ht 5\' 4"  (1.626 m)   Wt 133 lb (60.3 kg)   LMP 08/19/2019   BMI 22.83 kg/m  General: NAD HEENT: normocephalic, atraumatic Thyroid: +enlargement, no palpable nodules Pulmonary: Normal rate and effort, CTAB Cardiovascular: RRR Breast: Breast symmetrical, no tenderness, no palpable nodules or masses, no skin or nipple retraction present, no nipple discharge. No axillary or supraclavicular lymphadenopathy. Abdomen: soft, non-tender, non-distended. No hepatomegaly, splenomegaly or masses palpable. No evidence of hernia  Genitourinary:  External: Normal external female genitalia. Normal urethral meatus  Vagina: Normal  vaginal mucosa, no evidence of prolapse   Cervix: Grossly normal in appearance, no bleeding  Uterus: deferred  Adnexa: deferred  Rectal: deferred Extremities: no edema, erythema, or tenderness Neurologic: Grossly intact Psychiatric: mood appropriate, affect full  Female chaperone present for pelvic and breast portions of the physical exam  Assessment:  1. Well woman exam with routine gynecological exam   - pap today per request, discussed recommendations for 3-5 yr interval with normal result - 3D Mammogram ordered  2. Decreased libido - discussed option of medication but has side effects and restrictions - discussed using lubrication with sex  3. Polymenorrhea - could be perimenopausal, was young with menarche - consider symptom management - agrees to expectant mngt for now  4. Enlarged thyroid - followed by  PCP  Plan: Follow up with GYN in 1 year or prn Follow up with PCP as scheduled  Julianne Handler, CNM 09/02/2019 11:06 AM

## 2019-09-05 LAB — CYTOLOGY - PAP
Adequacy: ABSENT
Comment: NEGATIVE
Diagnosis: NEGATIVE
High risk HPV: NEGATIVE

## 2019-10-03 ENCOUNTER — Other Ambulatory Visit: Payer: Self-pay | Admitting: Physician Assistant

## 2019-10-03 ENCOUNTER — Other Ambulatory Visit (HOSPITAL_BASED_OUTPATIENT_CLINIC_OR_DEPARTMENT_OTHER): Payer: Self-pay | Admitting: Physician Assistant

## 2019-10-03 DIAGNOSIS — Z1231 Encounter for screening mammogram for malignant neoplasm of breast: Secondary | ICD-10-CM

## 2019-10-06 ENCOUNTER — Ambulatory Visit (INDEPENDENT_AMBULATORY_CARE_PROVIDER_SITE_OTHER): Payer: BC Managed Care – PPO

## 2019-10-06 ENCOUNTER — Other Ambulatory Visit: Payer: Self-pay

## 2019-10-06 DIAGNOSIS — Z1231 Encounter for screening mammogram for malignant neoplasm of breast: Secondary | ICD-10-CM | POA: Diagnosis not present

## 2019-10-10 NOTE — Progress Notes (Signed)
Normal mammo. Follow up in 1 year.

## 2019-11-15 ENCOUNTER — Other Ambulatory Visit: Payer: Self-pay | Admitting: Physician Assistant

## 2019-11-15 DIAGNOSIS — R4589 Other symptoms and signs involving emotional state: Secondary | ICD-10-CM

## 2019-11-15 DIAGNOSIS — R454 Irritability and anger: Secondary | ICD-10-CM

## 2019-11-15 DIAGNOSIS — R4586 Emotional lability: Secondary | ICD-10-CM

## 2019-12-12 ENCOUNTER — Other Ambulatory Visit: Payer: Self-pay | Admitting: Physician Assistant

## 2019-12-12 DIAGNOSIS — R4586 Emotional lability: Secondary | ICD-10-CM

## 2019-12-12 DIAGNOSIS — R454 Irritability and anger: Secondary | ICD-10-CM

## 2019-12-12 DIAGNOSIS — R4589 Other symptoms and signs involving emotional state: Secondary | ICD-10-CM

## 2019-12-24 ENCOUNTER — Other Ambulatory Visit: Payer: Self-pay | Admitting: Physician Assistant

## 2019-12-24 DIAGNOSIS — R4586 Emotional lability: Secondary | ICD-10-CM

## 2019-12-24 DIAGNOSIS — R4589 Other symptoms and signs involving emotional state: Secondary | ICD-10-CM

## 2019-12-24 DIAGNOSIS — R454 Irritability and anger: Secondary | ICD-10-CM

## 2020-01-03 ENCOUNTER — Encounter: Payer: Self-pay | Admitting: Physician Assistant

## 2020-01-03 ENCOUNTER — Telehealth (INDEPENDENT_AMBULATORY_CARE_PROVIDER_SITE_OTHER): Payer: BC Managed Care – PPO | Admitting: Physician Assistant

## 2020-01-03 VITALS — Ht 64.0 in | Wt 133.0 lb

## 2020-01-03 DIAGNOSIS — R454 Irritability and anger: Secondary | ICD-10-CM | POA: Diagnosis not present

## 2020-01-03 DIAGNOSIS — R4589 Other symptoms and signs involving emotional state: Secondary | ICD-10-CM

## 2020-01-03 DIAGNOSIS — Z79899 Other long term (current) drug therapy: Secondary | ICD-10-CM

## 2020-01-03 DIAGNOSIS — R4586 Emotional lability: Secondary | ICD-10-CM

## 2020-01-03 MED ORDER — BUPROPION HCL ER (XL) 150 MG PO TB24: 150.0000 mg | ORAL_TABLET | Freq: Every day | ORAL | 3 refills | Status: DC

## 2020-01-03 NOTE — Progress Notes (Signed)
Needs refill of Wellbutrin.  PHQ9 (6) -GAD7 (4) completed.  Had Covid vaccine but will get dates to [place], doesn't have with her.

## 2020-01-03 NOTE — Progress Notes (Signed)
Patient ID: Barbara Bauer, female   DOB: 1976-03-25, 44 y.o.   MRN: [id number] .Marland KitchenVirtual Visit via Video Note  I connected with Barbara Bauer on 01/03/20 at  1:00 PM EDT by a video enabled telemedicine application and verified that I am speaking with the correct person using two identifiers.  Location: Patient: home Provider: clinic   I discussed the limitations of evaluation and management by telemedicine and the availability of in person appointments. The patient expressed understanding and agreed to proceed.  History of Present Illness: Pt is a 44 yo female with mood changes, anxiety, depression who calls into the clinic for medication refill.   She is doing well. No concerns. No SI/HC. Taking wellbutrin daily.   .. 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 04/18/2014   Eosinophilia 09/18/2014   Abnormal weight gain 09/05/2015   Night sweats 09/05/2015   Bilateral edema of lower extremity 09/05/2015   Adjustment insomnia 07/30/2016   Adjustment disorder with mixed anxiety and depressed mood 07/30/2016   History of herpes genitalis 10/02/2017   No energy 10/09/2017   Dysphagia 10/09/2017   Missed period 08/31/2018   Soreness breast 08/31/2018   Hot flashes 08/31/2018   Hypercalcemia 09/01/2018   Irritable 04/28/2019   Mood changes 04/28/2019   Depressed mood 04/28/2019   Resolved Ambulatory Problems    Diagnosis Date Noted   No Resolved Ambulatory Problems   Past Medical History:  Diagnosis Date   Edema leg    Genital warts    Goiter    HPV in female    Hyperlipidemia     REviewed med, allergy, problem list.     Observations/Objective: No acute distress Normal mood and appearance.  Normal breathing.   .. Today's Vitals   01/03/20 1300  Weight: 133 lb (60.3 kg)  Height: 5\' 4"  (1.626 m)   Body mass index is 22.83 kg/m.   .. Depression screen [facility] 2/9 01/03/2020 04/28/2019 10/09/2017 07/30/2016  Decreased Interest 1 0 0 1  Down, Depressed, Hopeless 1 2 0 1  PHQ - 2 Score 2 2 0 2  Altered sleeping 0 0 2 -  Tired, decreased energy 2 0 1 -  Change in appetite 0 0 0 -  Feeling bad or failure about yourself  1 0 0 -  Trouble concentrating 1 1 0 -  Moving slowly or fidgety/restless 0 0 0 -  Suicidal thoughts 0 0 0 -  PHQ-9 Score 6 3 3  -  Difficult doing work/chores Somewhat difficult Somewhat difficult Not difficult at all -   .. GAD 7 : Generalized Anxiety Score 01/03/2020 04/28/2019 10/09/2017  Nervous, Anxious, on Edge 1 1 0  Control/stop worrying 0 1 0  Worry too much - different things 1 1 0  Trouble relaxing 1 0 1  Restless 0 0 0  Easily annoyed or irritable 1 1 1   Afraid - awful might happen 0 0 0  Total GAD 7 Score 4 4 2   Anxiety Difficulty Not difficult at all Somewhat difficult Not difficult at all     Assessment and Plan: Marland KitchenMarland KitchenZarria was seen today for medication refill.  Diagnoses and all orders for this  visit:  Mood changes -     buPROPion (WELLBUTRIN XL) 150 MG 24 hr tablet; Take 1 tablet (150 mg total) by mouth daily.  Irritable -     buPROPion (WELLBUTRIN XL) 150 MG 24 hr tablet; Take 1 tablet (150 mg total) by mouth daily.  Depressed mood -     buPROPion (WELLBUTRIN XL) 150 MG 24 hr tablet; Take 1 tablet (150 mg total) by mouth daily.  Medication management -     COMPLETE METABOLIC PANEL WITH GFR   PHQ/GAD stable.  Refilled wellbutrin.  CMP ordered for medication management.  Follow up in 1 year.  Continue counseling/exercise/meditation.   Follow Up Instructions:    I discussed the assessment and treatment  plan with the patient. The patient was provided an opportunity to ask questions and all were answered. The patient agreed with the plan and demonstrated an understanding of the instructions.   The patient was advised to call back or seek an in-person evaluation if the symptoms worsen or if the condition fails to improve as anticipated.  I provided 7 minutes of non-face-to-face time during this encounter.   Iran Planas, PA-C

## 2020-01-04 DIAGNOSIS — F4320 Adjustment disorder, unspecified: Secondary | ICD-10-CM | POA: Diagnosis not present

## 2020-01-10 DIAGNOSIS — F4323 Adjustment disorder with mixed anxiety and depressed mood: Secondary | ICD-10-CM | POA: Diagnosis not present

## 2020-01-17 DIAGNOSIS — F4323 Adjustment disorder with mixed anxiety and depressed mood: Secondary | ICD-10-CM | POA: Diagnosis not present

## 2020-01-23 DIAGNOSIS — F4323 Adjustment disorder with mixed anxiety and depressed mood: Secondary | ICD-10-CM | POA: Diagnosis not present

## 2020-02-01 DIAGNOSIS — F4323 Adjustment disorder with mixed anxiety and depressed mood: Secondary | ICD-10-CM | POA: Diagnosis not present

## 2020-02-08 DIAGNOSIS — F4323 Adjustment disorder with mixed anxiety and depressed mood: Secondary | ICD-10-CM | POA: Diagnosis not present

## 2020-02-13 DIAGNOSIS — F4323 Adjustment disorder with mixed anxiety and depressed mood: Secondary | ICD-10-CM | POA: Diagnosis not present

## 2020-02-21 DIAGNOSIS — F4323 Adjustment disorder with mixed anxiety and depressed mood: Secondary | ICD-10-CM | POA: Diagnosis not present

## 2020-02-27 DIAGNOSIS — F4323 Adjustment disorder with mixed anxiety and depressed mood: Secondary | ICD-10-CM | POA: Diagnosis not present

## 2020-03-05 ENCOUNTER — Other Ambulatory Visit: Payer: Self-pay | Admitting: Physician Assistant

## 2020-03-05 DIAGNOSIS — R1310 Dysphagia, unspecified: Secondary | ICD-10-CM

## 2020-03-05 DIAGNOSIS — K219 Gastro-esophageal reflux disease without esophagitis: Secondary | ICD-10-CM

## 2020-03-12 DIAGNOSIS — F4323 Adjustment disorder with mixed anxiety and depressed mood: Secondary | ICD-10-CM | POA: Diagnosis not present

## 2020-04-04 DIAGNOSIS — H1013 Acute atopic conjunctivitis, bilateral: Secondary | ICD-10-CM | POA: Diagnosis not present

## 2020-04-04 DIAGNOSIS — F4323 Adjustment disorder with mixed anxiety and depressed mood: Secondary | ICD-10-CM | POA: Diagnosis not present

## 2020-04-11 DIAGNOSIS — F4323 Adjustment disorder with mixed anxiety and depressed mood: Secondary | ICD-10-CM | POA: Diagnosis not present

## 2020-04-25 DIAGNOSIS — F4323 Adjustment disorder with mixed anxiety and depressed mood: Secondary | ICD-10-CM | POA: Diagnosis not present

## 2020-04-26 ENCOUNTER — Emergency Department
Admission: EM | Admit: 2020-04-26 | Discharge: 2020-04-26 | Disposition: A | Discharge status: Home / Self Care | Payer: BC Managed Care – PPO | Source: Home / Self Care

## 2020-04-26 ENCOUNTER — Emergency Department: Admit: 2020-04-26 | Discharge status: Absent without leave | Payer: Self-pay

## 2020-04-26 ENCOUNTER — Other Ambulatory Visit: Payer: Self-pay

## 2020-04-26 DIAGNOSIS — M62830 Muscle spasm of back: Secondary | ICD-10-CM | POA: Diagnosis not present

## 2020-04-26 DIAGNOSIS — M25512 Pain in left shoulder: Secondary | ICD-10-CM

## 2020-04-26 MED ORDER — CYCLOBENZAPRINE HCL 5 MG PO TABS: 5.0000 mg | ORAL_TABLET | Freq: Two times a day (BID) | ORAL | 0 refills | Status: DC | PRN

## 2020-04-26 NOTE — ED Triage Notes (Signed)
Pt presents to Urgent Care with c/o upper back/neck pain and L arm pain following COVID booster shot to L arm 3 days ago. Reports initially her arm was sore, but she woke up the morning after the shot w/ pain in her neck/upper back. No injury reported.

## 2020-04-26 NOTE — Discharge Instructions (Addendum)
  You can continue to alternate Tylenol and Advil for pain as well as alternating cool and warm compresses, gentle massage and gentle stretching.  Call to schedule a follow up exam with your PCP or sports medicine in the same office next week if not improving.

## 2020-04-26 NOTE — ED Provider Notes (Addendum)
[facility]    CSN: [id number] Arrival date & time: 04/26/20  1056      History   Chief Complaint Chief Complaint  Patient presents with  . Back Pain  . Arm pain    HPI Barbara Bauer is a 44 y.o. female.   HPI  Barbara Bauer is a 44 y.o. female presenting to UC with c/o Left side neck, upper back and left upper arm pain that started 3 days ago after getting her COVID booster vaccine. Pt not sure if it is related because she has had mild Left side neck soreness in the morning, attributed to sleeping bad.  She has tried tylenol and Advil along with a massage gun with mild relief. No weakness or numbness in arms or legs. No known injury. She is right hand dominant. Denies rashes or bruising at injection site.    Past Medical History:  Diagnosis Date  . Edema leg   . Eosinophilia   . Genital warts   . GERD (gastroesophageal reflux disease)   . Goiter    MD watching - no treatment  . HPV in female   . Hyperlipidemia    diet controlled  . Thyroid goiter     Patient Active Problem List   Diagnosis Date Noted  . Irritable 04/28/2019  . Mood changes 04/28/2019  . Depressed mood 04/28/2019  . Hypercalcemia 09/01/2018  . Missed period 08/31/2018  . Soreness breast 08/31/2018  . Hot flashes 08/31/2018  . No energy 10/09/2017  . Dysphagia 10/09/2017  . History of herpes genitalis 10/02/2017  . Adjustment insomnia 07/30/2016  . Adjustment disorder with mixed anxiety and depressed mood 07/30/2016  . Abnormal weight gain 09/05/2015  . Night sweats 09/05/2015  . Bilateral edema of lower extremity 09/05/2015  . Eosinophilia 09/18/2014  . Cervical intraepithelial neoplasia grade 1 04/18/2014  . Abnormal Papanicolaou smear of cervix with positive human papilloma virus (HPV) test 04/18/2014  . HPV test positive 04/13/2013  . Thyroid goiter 03/16/2013  . Hx of abnormal Pap smear 03/16/2013  . GERD (gastroesophageal reflux disease) 03/16/2013  . General  counselling and advice on contraception 02/16/2013    Past Surgical History:  Procedure Laterality Date  . ESOPHAGOGASTRODUODENOSCOPY ENDOSCOPY    . EYE SURGERY Bilateral    lasik   . LAPAROSCOPIC TUBAL LIGATION Bilateral 10/03/2016   Procedure: LAPAROSCOPIC TUBAL LIGATION;  Surgeon: Lesly Dukes, MD;  Location: [facility];  Service: Gynecology;  Laterality: Bilateral;  . UPPER GASTROINTESTINAL ENDOSCOPY    . WISDOM TOOTH EXTRACTION      OB History    Gravida  0   Para  0   Term  0   Preterm  0   AB  0   Living  0     SAB  0   IAB  0   Ectopic  0   Multiple  0   Live Births               Home Medications    Prior to Admission medications   Medication Sig Start Date End Date Taking? Authorizing Provider  cyclobenzaprine (FLEXERIL) 5 MG tablet Take 1-2 tablets (5-10 mg total) by mouth 2 (two) times daily as needed for muscle spasms. 04/26/20  Yes Hikeem Andersson O, PA-C  cycloSPORINE (RESTASIS) 0.05 % ophthalmic emulsion 1 drop 2 (two) times daily.   Yes [provider]  buPROPion (WELLBUTRIN XL) 150 MG 24 hr tablet Take 1 tablet (150 mg total) by mouth daily. 01/03/20  Breeback, Jade L, PA-C  cholecalciferol (VITAMIN D3) 25 MCG (1000 UT) tablet Take 1,000 Units by mouth daily.    [provider]  hydrochlorothiazide (HYDRODIURIL) 12.5 MG tablet Take 1 -2 tablets as needed for lower extremity swelling. 04/28/19   Breeback, Royetta Car, PA-C  ibuprofen (ADVIL,MOTRIN) 600 MG tablet Take 1 tablet (600 mg total) by mouth every 6 (six) hours as needed. 10/03/16   Guss Bunde, MD  Multiple Vitamin (MULTIVITAMIN) tablet Take 1 tablet by mouth daily.    [provider]  omeprazole (PRILOSEC) 40 MG capsule Take 1 capsule (40 mg total) by mouth daily. NEEDS LABS 03/05/20   Donella Stade, PA-C  Probiotic Product (PROBIOTIC DAILY PO) Take 1 capsule by mouth daily.     [provider]    Family History Family History  Problem Relation Age of  Onset  . Other Father        Pityriasis Ruba Pilaris  . Healthy Mother   . Breast cancer Maternal Grandmother   . Heart attack Maternal Grandfather   . Colon cancer Neg Hx   . Esophageal cancer Neg Hx   . Stomach cancer Neg Hx   . Pancreatic cancer Neg Hx   . Colon polyps Neg Hx   . Diabetes Neg Hx   . Kidney disease Neg Hx   . Liver disease Neg Hx   . Rectal cancer Neg Hx     Social History Social History   Tobacco Use  . Smoking status: Never Smoker  . Smokeless tobacco: Never Used  Vaping Use  . Vaping Use: Never used  Substance Use Topics  . Alcohol use: Yes    Alcohol/week: 0.0 standard drinks    Comment: minimal  . Drug use: No     Allergies   Penicillins   Review of Systems Review of Systems  Constitutional: Negative for chills and fever.  Musculoskeletal: Positive for back pain, myalgias and neck pain. Negative for neck stiffness.  Skin: Negative for color change, rash and wound.  Neurological: Negative for weakness and numbness.     Physical Exam Triage Vital Signs ED Triage Vitals  Enc Vitals Group     BP 04/26/20 1203 119/78     Pulse Rate 04/26/20 1203 76     Resp 04/26/20 1203 18     Temp 04/26/20 1203 98.2 F (36.8 C)     Temp Source 04/26/20 1203 Oral     SpO2 04/26/20 1203 96 %     Weight 04/26/20 1159 145 lb (65.8 kg)     Height 04/26/20 1159 5\' 4"  (1.626 m)     Head Circumference --      Peak Flow --      Pain Score 04/26/20 1159 7     Pain Loc --      Pain Edu? --      Excl. in [place]? --    No data found.  Updated Vital Signs BP 119/78 (BP Location: Right Arm)   Pulse 76   Temp 98.2 F (36.8 C) (Oral)   Resp 18   Ht 5\' 4"  (1.626 m)   Wt 145 lb (65.8 kg)   LMP 04/11/2020   SpO2 96%   BMI 24.89 kg/m   Visual Acuity Right Eye Distance:   Left Eye Distance:   Bilateral Distance:    Right Eye Near:   Left Eye Near:    Bilateral Near:     Physical Exam Vitals and nursing note reviewed.  Constitutional:  General: She is not in acute distress.    Appearance: Normal appearance. She is well-developed and well-nourished. She is not ill-appearing, toxic-appearing or diaphoretic.  HENT:     Head: Normocephalic and atraumatic.  Eyes:     Extraocular Movements: EOM normal.  Cardiovascular:     Rate and Rhythm: Normal rate and regular rhythm.     Pulses:          Radial pulses are 2+ on the left side.  Pulmonary:     Effort: Pulmonary effort is normal. No respiratory distress.     Breath sounds: Normal breath sounds.  Musculoskeletal:        General: Tenderness present. Normal range of motion.     Cervical back: Normal range of motion and neck supple. Tenderness present. No rigidity. Muscular tenderness (left side) present. No spinous process tenderness.       Back:     Comments: Left shoulder: no bony tenderness. Full ROM, no crepitus. 5/5 grip strength.   Skin:    General: Skin is warm and dry.     Capillary Refill: Capillary refill takes less than 2 seconds.     Findings: No bruising, erythema or rash.  Neurological:     Mental Status: She is alert and oriented to person, place, and time.     Sensory: No sensory deficit.     Motor: No weakness.  Psychiatric:        Mood and Affect: Mood and affect normal.        Behavior: Behavior normal.      UC Treatments / Results  Labs (all labs ordered are listed, but only abnormal results are displayed) Labs Reviewed - No data to display  EKG   Radiology No results found.  Procedures Procedures (including critical care time)  Medications Ordered in UC Medications - No data to display  Initial Impression / Assessment and Plan / UC Course  I have reviewed the triage vital signs and the nursing notes.  Pertinent labs & imaging results that were available during my care of the patient were reviewed by me and considered in my medical decision making (see chart for details).     Hx and exam c/w muscle pain Will tx with trial of  flexeril Home care discussed, advised pt flexeril can cause drowsiness AVS given  Final Clinical Impressions(s) / UC Diagnoses   Final diagnoses:  Muscle spasm of back  Acute pain of left shoulder     Discharge Instructions      You can continue to alternate Tylenol and Advil for pain as well as alternating cool and warm compresses, gentle massage and gentle stretching.  Call to schedule a follow up exam with your PCP or sports medicine in the same office next week if not improving.    ED Prescriptions    Medication Sig Dispense Auth. Provider   cyclobenzaprine (FLEXERIL) 5 MG tablet Take 1-2 tablets (5-10 mg total) by mouth 2 (two) times daily as needed for muscle spasms. 20 tablet Noe Gens, [place]     PDMP not reviewed this encounter.   Noe Gens, PA-C 04/26/20 1410    Noe Gens, [place] 04/26/20 1411

## 2020-05-01 DIAGNOSIS — M4712 Other spondylosis with myelopathy, cervical region: Secondary | ICD-10-CM | POA: Diagnosis not present

## 2020-05-01 DIAGNOSIS — M531 Cervicobrachial syndrome: Secondary | ICD-10-CM | POA: Diagnosis not present

## 2020-05-01 DIAGNOSIS — M9901 Segmental and somatic dysfunction of cervical region: Secondary | ICD-10-CM | POA: Diagnosis not present

## 2020-05-01 DIAGNOSIS — M5414 Radiculopathy, thoracic region: Secondary | ICD-10-CM | POA: Diagnosis not present

## 2020-05-02 DIAGNOSIS — M531 Cervicobrachial syndrome: Secondary | ICD-10-CM | POA: Diagnosis not present

## 2020-05-02 DIAGNOSIS — M9901 Segmental and somatic dysfunction of cervical region: Secondary | ICD-10-CM | POA: Diagnosis not present

## 2020-05-02 DIAGNOSIS — M5414 Radiculopathy, thoracic region: Secondary | ICD-10-CM | POA: Diagnosis not present

## 2020-05-02 DIAGNOSIS — M4712 Other spondylosis with myelopathy, cervical region: Secondary | ICD-10-CM | POA: Diagnosis not present

## 2020-05-04 DIAGNOSIS — M9901 Segmental and somatic dysfunction of cervical region: Secondary | ICD-10-CM | POA: Diagnosis not present

## 2020-05-04 DIAGNOSIS — M4712 Other spondylosis with myelopathy, cervical region: Secondary | ICD-10-CM | POA: Diagnosis not present

## 2020-05-04 DIAGNOSIS — M531 Cervicobrachial syndrome: Secondary | ICD-10-CM | POA: Diagnosis not present

## 2020-05-04 DIAGNOSIS — M5414 Radiculopathy, thoracic region: Secondary | ICD-10-CM | POA: Diagnosis not present

## 2020-05-07 DIAGNOSIS — M531 Cervicobrachial syndrome: Secondary | ICD-10-CM | POA: Diagnosis not present

## 2020-05-07 DIAGNOSIS — M9901 Segmental and somatic dysfunction of cervical region: Secondary | ICD-10-CM | POA: Diagnosis not present

## 2020-05-07 DIAGNOSIS — M5414 Radiculopathy, thoracic region: Secondary | ICD-10-CM | POA: Diagnosis not present

## 2020-05-07 DIAGNOSIS — M4712 Other spondylosis with myelopathy, cervical region: Secondary | ICD-10-CM | POA: Diagnosis not present

## 2020-05-09 DIAGNOSIS — M5414 Radiculopathy, thoracic region: Secondary | ICD-10-CM | POA: Diagnosis not present

## 2020-05-09 DIAGNOSIS — F4323 Adjustment disorder with mixed anxiety and depressed mood: Secondary | ICD-10-CM | POA: Diagnosis not present

## 2020-05-09 DIAGNOSIS — M531 Cervicobrachial syndrome: Secondary | ICD-10-CM | POA: Diagnosis not present

## 2020-05-09 DIAGNOSIS — M4712 Other spondylosis with myelopathy, cervical region: Secondary | ICD-10-CM | POA: Diagnosis not present

## 2020-05-09 DIAGNOSIS — M9901 Segmental and somatic dysfunction of cervical region: Secondary | ICD-10-CM | POA: Diagnosis not present

## 2020-05-11 DIAGNOSIS — M5414 Radiculopathy, thoracic region: Secondary | ICD-10-CM | POA: Diagnosis not present

## 2020-05-11 DIAGNOSIS — M4712 Other spondylosis with myelopathy, cervical region: Secondary | ICD-10-CM | POA: Diagnosis not present

## 2020-05-11 DIAGNOSIS — M9901 Segmental and somatic dysfunction of cervical region: Secondary | ICD-10-CM | POA: Diagnosis not present

## 2020-05-11 DIAGNOSIS — M531 Cervicobrachial syndrome: Secondary | ICD-10-CM | POA: Diagnosis not present

## 2020-05-22 DIAGNOSIS — F4323 Adjustment disorder with mixed anxiety and depressed mood: Secondary | ICD-10-CM | POA: Diagnosis not present

## 2020-05-30 DIAGNOSIS — F4323 Adjustment disorder with mixed anxiety and depressed mood: Secondary | ICD-10-CM | POA: Diagnosis not present

## 2020-06-06 DIAGNOSIS — F4323 Adjustment disorder with mixed anxiety and depressed mood: Secondary | ICD-10-CM | POA: Diagnosis not present

## 2020-06-13 DIAGNOSIS — F4323 Adjustment disorder with mixed anxiety and depressed mood: Secondary | ICD-10-CM | POA: Diagnosis not present

## 2020-06-19 DIAGNOSIS — F4323 Adjustment disorder with mixed anxiety and depressed mood: Secondary | ICD-10-CM | POA: Diagnosis not present

## 2020-06-27 DIAGNOSIS — F4323 Adjustment disorder with mixed anxiety and depressed mood: Secondary | ICD-10-CM | POA: Diagnosis not present

## 2020-07-04 DIAGNOSIS — F4323 Adjustment disorder with mixed anxiety and depressed mood: Secondary | ICD-10-CM | POA: Diagnosis not present

## 2020-07-11 DIAGNOSIS — F4323 Adjustment disorder with mixed anxiety and depressed mood: Secondary | ICD-10-CM | POA: Diagnosis not present

## 2020-07-18 DIAGNOSIS — F4323 Adjustment disorder with mixed anxiety and depressed mood: Secondary | ICD-10-CM | POA: Diagnosis not present

## 2020-07-25 DIAGNOSIS — F4323 Adjustment disorder with mixed anxiety and depressed mood: Secondary | ICD-10-CM | POA: Diagnosis not present

## 2020-08-01 DIAGNOSIS — F4323 Adjustment disorder with mixed anxiety and depressed mood: Secondary | ICD-10-CM | POA: Diagnosis not present

## 2020-08-02 DIAGNOSIS — M542 Cervicalgia: Secondary | ICD-10-CM | POA: Diagnosis not present

## 2020-08-02 DIAGNOSIS — M25519 Pain in unspecified shoulder: Secondary | ICD-10-CM | POA: Diagnosis not present

## 2020-08-02 DIAGNOSIS — M546 Pain in thoracic spine: Secondary | ICD-10-CM | POA: Diagnosis not present

## 2020-08-05 ENCOUNTER — Encounter: Payer: Self-pay | Admitting: Physician Assistant

## 2020-08-05 DIAGNOSIS — R232 Flushing: Secondary | ICD-10-CM

## 2020-08-05 DIAGNOSIS — Z13220 Encounter for screening for lipoid disorders: Secondary | ICD-10-CM

## 2020-08-05 DIAGNOSIS — R4586 Emotional lability: Secondary | ICD-10-CM

## 2020-08-05 DIAGNOSIS — Z131 Encounter for screening for diabetes mellitus: Secondary | ICD-10-CM

## 2020-08-05 DIAGNOSIS — Z0000 Encounter for general adult medical examination without abnormal findings: Secondary | ICD-10-CM

## 2020-08-06 DIAGNOSIS — M546 Pain in thoracic spine: Secondary | ICD-10-CM | POA: Diagnosis not present

## 2020-08-06 DIAGNOSIS — M25519 Pain in unspecified shoulder: Secondary | ICD-10-CM | POA: Diagnosis not present

## 2020-08-06 DIAGNOSIS — M542 Cervicalgia: Secondary | ICD-10-CM | POA: Diagnosis not present

## 2020-08-09 ENCOUNTER — Telehealth: Payer: Self-pay

## 2020-08-09 DIAGNOSIS — R232 Flushing: Secondary | ICD-10-CM

## 2020-08-09 DIAGNOSIS — R4586 Emotional lability: Secondary | ICD-10-CM

## 2020-08-09 DIAGNOSIS — E049 Nontoxic goiter, unspecified: Secondary | ICD-10-CM

## 2020-08-09 DIAGNOSIS — Z0000 Encounter for general adult medical examination without abnormal findings: Secondary | ICD-10-CM

## 2020-08-09 DIAGNOSIS — E7800 Pure hypercholesterolemia, unspecified: Secondary | ICD-10-CM

## 2020-08-09 DIAGNOSIS — N926 Irregular menstruation, unspecified: Secondary | ICD-10-CM

## 2020-08-09 DIAGNOSIS — R635 Abnormal weight gain: Secondary | ICD-10-CM

## 2020-08-09 NOTE — Telephone Encounter (Signed)
C[place]ery called and stated that some of the labs that were ordered insurance did not want to cover. She said it was the TSH, FSH/LH and Estradiol. She was asking for assistance with Dx codes to try and get some of those covered.

## 2020-08-10 DIAGNOSIS — M25519 Pain in unspecified shoulder: Secondary | ICD-10-CM | POA: Diagnosis not present

## 2020-08-10 DIAGNOSIS — M542 Cervicalgia: Secondary | ICD-10-CM | POA: Diagnosis not present

## 2020-08-10 DIAGNOSIS — M546 Pain in thoracic spine: Secondary | ICD-10-CM | POA: Diagnosis not present

## 2020-08-13 NOTE — Telephone Encounter (Signed)
Symptoms were night sweats, mood changes, weight gain  These are diagnosis codes used.  Preventative health care [Z00.00] - Primary     Hot flashes [R23.2]     Mood changes [R45.86]      In 2020 she did have abnormal menstrual cycles, not sure what else we could use to cover. Levada Dy - do you know?

## 2020-08-13 NOTE — Telephone Encounter (Signed)
We can just list her symptoms. Hot flashes, irregular period etc. Confirm with patient and list every reason why checking and give patient diagnoses codes.

## 2020-08-14 DIAGNOSIS — M542 Cervicalgia: Secondary | ICD-10-CM | POA: Diagnosis not present

## 2020-08-14 DIAGNOSIS — M25519 Pain in unspecified shoulder: Secondary | ICD-10-CM | POA: Diagnosis not present

## 2020-08-14 DIAGNOSIS — M546 Pain in thoracic spine: Secondary | ICD-10-CM | POA: Diagnosis not present

## 2020-08-15 DIAGNOSIS — F4323 Adjustment disorder with mixed anxiety and depressed mood: Secondary | ICD-10-CM | POA: Diagnosis not present

## 2020-08-20 DIAGNOSIS — R300 Dysuria: Secondary | ICD-10-CM | POA: Diagnosis not present

## 2020-08-20 DIAGNOSIS — M542 Cervicalgia: Secondary | ICD-10-CM | POA: Diagnosis not present

## 2020-08-20 DIAGNOSIS — M25519 Pain in unspecified shoulder: Secondary | ICD-10-CM | POA: Diagnosis not present

## 2020-08-20 DIAGNOSIS — N3001 Acute cystitis with hematuria: Secondary | ICD-10-CM | POA: Diagnosis not present

## 2020-08-20 DIAGNOSIS — M546 Pain in thoracic spine: Secondary | ICD-10-CM | POA: Diagnosis not present

## 2020-08-21 NOTE — Telephone Encounter (Signed)
Added more codes to the labs. Left message advising patient.

## 2020-08-22 DIAGNOSIS — M542 Cervicalgia: Secondary | ICD-10-CM | POA: Diagnosis not present

## 2020-08-22 DIAGNOSIS — F4323 Adjustment disorder with mixed anxiety and depressed mood: Secondary | ICD-10-CM | POA: Diagnosis not present

## 2020-08-22 DIAGNOSIS — M546 Pain in thoracic spine: Secondary | ICD-10-CM | POA: Diagnosis not present

## 2020-08-22 DIAGNOSIS — M25519 Pain in unspecified shoulder: Secondary | ICD-10-CM | POA: Diagnosis not present

## 2020-08-27 DIAGNOSIS — M546 Pain in thoracic spine: Secondary | ICD-10-CM | POA: Diagnosis not present

## 2020-08-27 DIAGNOSIS — M25519 Pain in unspecified shoulder: Secondary | ICD-10-CM | POA: Diagnosis not present

## 2020-08-27 DIAGNOSIS — M542 Cervicalgia: Secondary | ICD-10-CM | POA: Diagnosis not present

## 2020-08-29 DIAGNOSIS — F4323 Adjustment disorder with mixed anxiety and depressed mood: Secondary | ICD-10-CM | POA: Diagnosis not present

## 2020-08-30 DIAGNOSIS — M542 Cervicalgia: Secondary | ICD-10-CM | POA: Diagnosis not present

## 2020-08-30 DIAGNOSIS — M546 Pain in thoracic spine: Secondary | ICD-10-CM | POA: Diagnosis not present

## 2020-08-30 DIAGNOSIS — M25519 Pain in unspecified shoulder: Secondary | ICD-10-CM | POA: Diagnosis not present

## 2020-09-03 DIAGNOSIS — M25519 Pain in unspecified shoulder: Secondary | ICD-10-CM | POA: Diagnosis not present

## 2020-09-03 DIAGNOSIS — M546 Pain in thoracic spine: Secondary | ICD-10-CM | POA: Diagnosis not present

## 2020-09-03 DIAGNOSIS — M542 Cervicalgia: Secondary | ICD-10-CM | POA: Diagnosis not present

## 2020-09-05 DIAGNOSIS — M25519 Pain in unspecified shoulder: Secondary | ICD-10-CM | POA: Diagnosis not present

## 2020-09-05 DIAGNOSIS — E049 Nontoxic goiter, unspecified: Secondary | ICD-10-CM | POA: Diagnosis not present

## 2020-09-05 DIAGNOSIS — N926 Irregular menstruation, unspecified: Secondary | ICD-10-CM | POA: Diagnosis not present

## 2020-09-05 DIAGNOSIS — R635 Abnormal weight gain: Secondary | ICD-10-CM | POA: Diagnosis not present

## 2020-09-05 DIAGNOSIS — E7800 Pure hypercholesterolemia, unspecified: Secondary | ICD-10-CM | POA: Diagnosis not present

## 2020-09-05 DIAGNOSIS — M546 Pain in thoracic spine: Secondary | ICD-10-CM | POA: Diagnosis not present

## 2020-09-05 DIAGNOSIS — M542 Cervicalgia: Secondary | ICD-10-CM | POA: Diagnosis not present

## 2020-09-05 DIAGNOSIS — F4323 Adjustment disorder with mixed anxiety and depressed mood: Secondary | ICD-10-CM | POA: Diagnosis not present

## 2020-09-06 LAB — COMPLETE METABOLIC PANEL WITH GFR
AG Ratio: 1.7 (calc) (ref 1.0–2.5)
ALT: 14 U/L (ref 6–29)
AST: 19 U/L (ref 10–30)
Albumin: 4.1 g/dL (ref 3.6–5.1)
Alkaline phosphatase (APISO): 63 U/L (ref 31–125)
BUN: 9 mg/dL (ref 7–25)
CO2: 25 mmol/L (ref 20–32)
Calcium: 9.2 mg/dL (ref 8.6–10.2)
Chloride: 106 mmol/L (ref 98–110)
Creat: 0.8 mg/dL (ref 0.50–1.10)
GFR, Est African American: 104 mL/min/{1.73_m2} (ref >=60)
GFR, Est Non African American: 90 mL/min/{1.73_m2} (ref >=60)
Globulin: 2.4 g/dL (calc) (ref 1.9–3.7)
Glucose, Bld: 91 mg/dL (ref 65–99)
Potassium: 4 mmol/L (ref 3.5–5.3)
Sodium: 139 mmol/L (ref 135–146)
Total Bilirubin: 0.4 mg/dL (ref 0.2–1.2)
Total Protein: 6.5 g/dL (ref 6.1–8.1)

## 2020-09-06 LAB — TSH: TSH: 1.21 mIU/L

## 2020-09-06 LAB — LIPID PANEL W/REFLEX DIRECT LDL
Cholesterol: 211 mg/dL — ABNORMAL HIGH (ref <200)
HDL: 69 mg/dL (ref >=50)
LDL Cholesterol (Calc): 126 mg/dL (calc) — ABNORMAL HIGH
Non-HDL Cholesterol (Calc): 142 mg/dL (calc) — ABNORMAL HIGH (ref <130)
Total CHOL/HDL Ratio: 3.1 (calc) (ref <5.0)
Triglycerides: 67 mg/dL (ref <150)

## 2020-09-06 NOTE — Telephone Encounter (Signed)
Barbara Bauer,   Your LDL, bad cholesterol, has improved.  Your HDL, good cholesterol, looks excellent.  Your glucose, kidney, liver look amazing.  Your thyroid normal.   Hormones pending.

## 2020-09-07 ENCOUNTER — Other Ambulatory Visit: Payer: Self-pay

## 2020-09-07 DIAGNOSIS — R1310 Dysphagia, unspecified: Secondary | ICD-10-CM

## 2020-09-07 DIAGNOSIS — K219 Gastro-esophageal reflux disease without esophagitis: Secondary | ICD-10-CM

## 2020-09-07 MED ORDER — OMEPRAZOLE 40 MG PO CPDR: 40.0000 mg | DELAYED_RELEASE_CAPSULE | Freq: Every day | ORAL | 3 refills | Status: DC

## 2020-09-12 DIAGNOSIS — M25519 Pain in unspecified shoulder: Secondary | ICD-10-CM | POA: Diagnosis not present

## 2020-09-12 DIAGNOSIS — M542 Cervicalgia: Secondary | ICD-10-CM | POA: Diagnosis not present

## 2020-09-12 DIAGNOSIS — M546 Pain in thoracic spine: Secondary | ICD-10-CM | POA: Diagnosis not present

## 2020-09-12 DIAGNOSIS — F4323 Adjustment disorder with mixed anxiety and depressed mood: Secondary | ICD-10-CM | POA: Diagnosis not present

## 2020-09-19 DIAGNOSIS — F4323 Adjustment disorder with mixed anxiety and depressed mood: Secondary | ICD-10-CM | POA: Diagnosis not present

## 2020-09-26 DIAGNOSIS — F4323 Adjustment disorder with mixed anxiety and depressed mood: Secondary | ICD-10-CM | POA: Diagnosis not present

## 2020-09-27 DIAGNOSIS — M79605 Pain in left leg: Secondary | ICD-10-CM | POA: Diagnosis not present

## 2020-09-27 DIAGNOSIS — M79604 Pain in right leg: Secondary | ICD-10-CM | POA: Diagnosis not present

## 2020-10-03 DIAGNOSIS — F4323 Adjustment disorder with mixed anxiety and depressed mood: Secondary | ICD-10-CM | POA: Diagnosis not present

## 2020-10-10 DIAGNOSIS — F4323 Adjustment disorder with mixed anxiety and depressed mood: Secondary | ICD-10-CM | POA: Diagnosis not present

## 2020-10-17 DIAGNOSIS — F4323 Adjustment disorder with mixed anxiety and depressed mood: Secondary | ICD-10-CM | POA: Diagnosis not present

## 2020-10-31 DIAGNOSIS — F4323 Adjustment disorder with mixed anxiety and depressed mood: Secondary | ICD-10-CM | POA: Diagnosis not present

## 2020-11-07 DIAGNOSIS — F4323 Adjustment disorder with mixed anxiety and depressed mood: Secondary | ICD-10-CM | POA: Diagnosis not present

## 2020-11-14 DIAGNOSIS — F4323 Adjustment disorder with mixed anxiety and depressed mood: Secondary | ICD-10-CM | POA: Diagnosis not present

## 2020-11-21 DIAGNOSIS — F4323 Adjustment disorder with mixed anxiety and depressed mood: Secondary | ICD-10-CM | POA: Diagnosis not present

## 2020-11-26 DIAGNOSIS — F4323 Adjustment disorder with mixed anxiety and depressed mood: Secondary | ICD-10-CM | POA: Diagnosis not present

## 2020-12-11 ENCOUNTER — Telehealth: Payer: Self-pay | Admitting: Neurology

## 2020-12-11 NOTE — Telephone Encounter (Signed)
Appt made with patient.  

## 2020-12-11 NOTE — Telephone Encounter (Signed)
Patient left vm stating she had been on Valtrex in the past, but has been off for a few years. The last 6-7 months she has had several outbreaks and she wants to restart daily Valtrex if appropriate.   Last visit 01/03/2020. Last prescribed 10/02/2017 by her gynecologist.   Patient needs a follow up appt.

## 2020-12-12 DIAGNOSIS — F4323 Adjustment disorder with mixed anxiety and depressed mood: Secondary | ICD-10-CM | POA: Diagnosis not present

## 2020-12-19 DIAGNOSIS — F4323 Adjustment disorder with mixed anxiety and depressed mood: Secondary | ICD-10-CM | POA: Diagnosis not present

## 2020-12-31 ENCOUNTER — Other Ambulatory Visit (HOSPITAL_BASED_OUTPATIENT_CLINIC_OR_DEPARTMENT_OTHER): Payer: Self-pay | Admitting: Physician Assistant

## 2020-12-31 DIAGNOSIS — Z1231 Encounter for screening mammogram for malignant neoplasm of breast: Secondary | ICD-10-CM

## 2021-01-01 ENCOUNTER — Ambulatory Visit (INDEPENDENT_AMBULATORY_CARE_PROVIDER_SITE_OTHER): Payer: BC Managed Care – PPO | Admitting: Physician Assistant

## 2021-01-01 ENCOUNTER — Other Ambulatory Visit: Payer: Self-pay

## 2021-01-01 ENCOUNTER — Encounter: Payer: Self-pay | Admitting: Physician Assistant

## 2021-01-01 VITALS — BP 115/66 | HR 73 | Temp 98.6°F

## 2021-01-01 DIAGNOSIS — R4586 Emotional lability: Secondary | ICD-10-CM | POA: Diagnosis not present

## 2021-01-01 DIAGNOSIS — R4589 Other symptoms and signs involving emotional state: Secondary | ICD-10-CM | POA: Diagnosis not present

## 2021-01-01 DIAGNOSIS — F5102 Adjustment insomnia: Secondary | ICD-10-CM

## 2021-01-01 DIAGNOSIS — R454 Irritability and anger: Secondary | ICD-10-CM

## 2021-01-01 DIAGNOSIS — Z8619 Personal history of other infectious and parasitic diseases: Secondary | ICD-10-CM

## 2021-01-01 DIAGNOSIS — N951 Menopausal and female climacteric states: Secondary | ICD-10-CM | POA: Insufficient documentation

## 2021-01-01 DIAGNOSIS — Z23 Encounter for immunization: Secondary | ICD-10-CM

## 2021-01-01 MED ORDER — BUPROPION HCL ER (XL) 150 MG PO TB24: 150.0000 mg | ORAL_TABLET | Freq: Every day | ORAL | 3 refills | Status: DC

## 2021-01-01 MED ORDER — VALACYCLOVIR HCL 500 MG PO TABS: 500.0000 mg | ORAL_TABLET | Freq: Every day | ORAL | 3 refills | Status: DC

## 2021-01-01 NOTE — Progress Notes (Signed)
Subjective:    Patient ID: Barbara Bauer, female    DOB: [birth year], 45 y.o.   MRN: [id number]  HPI Pt is a 45 yo female with mood changes, irritablity, herpes who presents to the clinic for medication changes.   Pt has had a few outbreaks of herpes and would like to go back on suppression valtrex.   Mood controlled. No SI/HC. Exercising regularly.   Starting to have more peri-menopausal symptoms and irregular periods.   .. 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 04/18/2014   Eosinophilia 09/18/2014   Abnormal weight gain 09/05/2015   Night sweats 09/05/2015   Bilateral edema of lower extremity 09/05/2015   Adjustment insomnia 07/30/2016   Adjustment disorder with mixed anxiety and depressed mood 07/30/2016   History of herpes genitalis 10/02/2017   No energy 10/09/2017   Dysphagia 10/09/2017   Missed period 08/31/2018   Soreness breast 08/31/2018   Hot flashes 08/31/2018   Hypercalcemia 09/01/2018   Irritable 04/28/2019   Mood changes 04/28/2019   Depressed mood 04/28/2019   Perimenopausal 01/01/2021   Resolved Ambulatory Problems    Diagnosis Date Noted   No Resolved Ambulatory Problems   Past Medical History:  Diagnosis Date   Edema leg    Genital warts    Goiter    HPV in female    Hyperlipidemia     Review of Systems  All other systems reviewed and are negative.     Objective:   Physical Exam Vitals reviewed.  Constitutional:      Appearance: Normal appearance.  HENT:     Head: Normocephalic.  Cardiovascular:     Rate and Rhythm: Normal rate and regular rhythm.     Pulses: Normal  pulses.     Heart sounds: Normal heart sounds.  Pulmonary:     Effort: Pulmonary effort is normal.     Breath sounds: Normal breath sounds.  Neurological:     General: No focal deficit present.     Mental Status: She is alert and oriented to person, place, and time.  Psychiatric:        Mood and Affect: Mood normal.      .. Depression screen [facility] 2/9 01/01/2021 01/03/2020 04/28/2019 10/09/2017 07/30/2016  Decreased Interest 0 1 0 0 1  Down, Depressed, Hopeless '1 1 2 '$ 0 1  PHQ - 2 Score '1 2 2 '$ 0 2  Altered sleeping 1 0 0 2 -  Tired, decreased energy 0 2 0 1 -  Change in appetite 0 0 0 0 -  Feeling bad or failure about yourself  0 1 0 0 -  Trouble concentrating 0 1 1 0 -  Moving slowly or fidgety/restless 0 0 0 0 -  Suicidal thoughts 0 0 0 0 -  PHQ-9 Score '2 6 3 3 '$ -  Difficult doing work/chores Not difficult at all Somewhat difficult Somewhat difficult Not difficult at all -   .. GAD 7 : Generalized Anxiety Score 01/01/2021 01/03/2020 04/28/2019 10/09/2017  Nervous, Anxious, on Edge '1 1 1 '$ 0  Control/stop worrying 1 0 1 0  Worry too much - different things '1 1 1 '$ 0  Trouble  relaxing 1 1 0 1  Restless 1 0 0 0  Easily annoyed or irritable '1 1 1 1  '$ Afraid - awful might happen 0 0 0 0  Total GAD 7 Score '6 4 4 2  '$ Anxiety Difficulty Not difficult at all Not difficult at all Somewhat difficult Not difficult at all        Assessment & Plan:   Marland KitchenMarland KitchenMaeven was seen today for depression and herpes genitalis.  Diagnoses and all orders for this visit:  Mood changes -     buPROPion (WELLBUTRIN XL) 150 MG 24 hr tablet; Take 1 tablet (150 mg total) by mouth daily.  Irritable -     buPROPion (WELLBUTRIN XL) 150 MG 24 hr tablet; Take 1 tablet (150 mg total) by mouth daily.  Depressed mood -     buPROPion (WELLBUTRIN XL) 150 MG 24 hr tablet; Take 1 tablet (150 mg total) by mouth daily.  History of herpes genitalis -     valACYclovir (VALTREX) 500 MG tablet; Take 1 tablet (500 mg total) by mouth  daily. Start at first sign of outbreak  Adjustment insomnia  Perimenopausal  Need for Tdap vaccination -     Tdap vaccine greater than or equal to 7yo IM  Discussed suppression therapy. Started valtrex '500mg'$  daily.   Discussed perimenopausal symptoms. Discussed tumeric, [place], lavender, ashwaganda. Last check 2020. Irregular menstrual periods.  GYN for pap follow up.   PHQ/GAD stable. Refilled wellbutrin.

## 2021-01-01 NOTE — Patient Instructions (Signed)
Tdap (Tetanus, Diphtheria, Pertussis) Vaccine: What You Need to Know 1. Why get vaccinated? Tdap vaccine can prevent tetanus, diphtheria, and pertussis. Diphtheria and pertussis spread from person to person. Tetanus enters the body through cuts or wounds. TETANUS (T) causes painful stiffening of the muscles. Tetanus can lead to serious health problems, including being unable to open the mouth, having trouble swallowing and breathing, or death. DIPHTHERIA (D) can lead to difficulty breathing, heart failure, paralysis, or death. PERTUSSIS (aP), also known as "whooping cough," can cause uncontrollable, violent coughing that makes it hard to breathe, eat, or drink. Pertussis can be extremely serious especially in babies and young children, causing pneumonia, convulsions, brain damage, or death. In teens and adults, it can cause weight loss, loss of bladder control, passing out, and rib fractures from severe coughing. 2. Tdap vaccine Tdap is only for children 7 years and older, adolescents, and adults.  Adolescents should receive a single dose of Tdap, preferably at age 11 or 12 years. Pregnant people should get a dose of Tdap during every pregnancy, preferably during the early part of the third trimester, to help protect the newborn from pertussis. Infants are most at risk for severe, life-threatening complications frompertussis. Adults who have never received Tdap should get a dose of Tdap. Also, adults should receive a booster dose of either Tdap or Td (a different vaccine that protects against tetanus and diphtheria but not pertussis) every 10 years, or after 5 years in the case of a severe or dirty wound or burn. Tdap may be given at the same time as other vaccines. 3. Talk with your health care provider Tell your vaccine provider if the person getting the vaccine: Has had an allergic reaction after a previous dose of any vaccine that protects against tetanus, diphtheria, or pertussis, or has any  severe, life-threatening allergies Has had a coma, decreased level of consciousness, or prolonged seizures within 7 days after a previous dose of any pertussis vaccine (DTP, DTaP, or Tdap) Has seizures or another nervous system problem Has ever had Guillain-Barr Syndrome (also called "GBS") Has had severe pain or swelling after a previous dose of any vaccine that protects against tetanus or diphtheria In some cases, your health care provider may decide to postpone Tdapvaccination until a future visit. People with minor illnesses, such as a cold, may be vaccinated. People who are moderately or severely ill should usually wait until they recover beforegetting Tdap vaccine.  Your health care provider can give you more information. 4. Risks of a vaccine reaction Pain, redness, or swelling where the shot was given, mild fever, headache, feeling tired, and nausea, vomiting, diarrhea, or stomachache sometimes happen after Tdap vaccination. People sometimes faint after medical procedures, including vaccination. Tellyour provider if you feel dizzy or have vision changes or ringing in the ears.  As with any medicine, there is a very remote chance of a vaccine causing asevere allergic reaction, other serious injury, or death. 5. What if there is a serious problem? An allergic reaction could occur after the vaccinated person leaves the clinic. If you see signs of a severe allergic reaction (hives, swelling of the face and throat, difficulty breathing, a fast heartbeat, dizziness, or weakness), call 9-1-1and get the person to the nearest hospital. For other signs that concern you, call your health care provider.  Adverse reactions should be reported to the Vaccine Adverse Event Reporting System (VAERS). Your health care provider will usually file this report, or you can do it yourself. Visit the   VAERS website at www.vaers.hhs.gov or call 1-800-822-7967. VAERS is only for reporting reactions, and VAERS staff  members do not give medical advice. 6. The National Vaccine Injury Compensation Program The National Vaccine Injury Compensation Program (VICP) is a federal program that was created to compensate people who may have been injured by certain vaccines. Claims regarding alleged injury or death due to vaccination have a time limit for filing, which may be as short as two years. Visit the VICP website at www.hrsa.gov/vaccinecompensation or call 1-800-338-2382to learn about the program and about filing a claim. 7. How can I learn more? Ask your health care provider. Call your local or state health department. Visit the website of the Food and Drug Administration (FDA) for vaccine package inserts and additional information at www.fda.gov/vaccines-blood-biologics/vaccines. Contact the Centers for Disease Control and Prevention (CDC): Call 1-800-232-4636 (1-800-CDC-INFO) or Visit CDC's website at www.cdc.gov/vaccines. Vaccine Information Statement Tdap (Tetanus, Diphtheria, Pertussis) Vaccine(12/02/2019) This information is not intended to replace advice given to you by your health care provider. Make sure you discuss any questions you have with your healthcare provider. Document Revised: 12/28/2019 Document Reviewed: 12/28/2019 Elsevier Patient Education  2022 Elsevier Inc.  

## 2021-01-02 ENCOUNTER — Ambulatory Visit (INDEPENDENT_AMBULATORY_CARE_PROVIDER_SITE_OTHER): Payer: BC Managed Care – PPO

## 2021-01-02 DIAGNOSIS — Z1231 Encounter for screening mammogram for malignant neoplasm of breast: Secondary | ICD-10-CM

## 2021-01-02 DIAGNOSIS — F4323 Adjustment disorder with mixed anxiety and depressed mood: Secondary | ICD-10-CM | POA: Diagnosis not present

## 2021-01-07 NOTE — Progress Notes (Signed)
Normal mammogram. Follow up in 1 year.

## 2021-01-09 DIAGNOSIS — F4323 Adjustment disorder with mixed anxiety and depressed mood: Secondary | ICD-10-CM | POA: Diagnosis not present

## 2021-01-16 DIAGNOSIS — F4323 Adjustment disorder with mixed anxiety and depressed mood: Secondary | ICD-10-CM | POA: Diagnosis not present

## 2021-01-23 DIAGNOSIS — F4323 Adjustment disorder with mixed anxiety and depressed mood: Secondary | ICD-10-CM | POA: Diagnosis not present

## 2021-01-30 DIAGNOSIS — F4323 Adjustment disorder with mixed anxiety and depressed mood: Secondary | ICD-10-CM | POA: Diagnosis not present

## 2021-02-06 DIAGNOSIS — F4323 Adjustment disorder with mixed anxiety and depressed mood: Secondary | ICD-10-CM | POA: Diagnosis not present

## 2021-02-13 DIAGNOSIS — F4323 Adjustment disorder with mixed anxiety and depressed mood: Secondary | ICD-10-CM | POA: Diagnosis not present

## 2021-02-19 DIAGNOSIS — D224 Melanocytic nevi of scalp and neck: Secondary | ICD-10-CM | POA: Diagnosis not present

## 2021-02-19 DIAGNOSIS — L821 Other seborrheic keratosis: Secondary | ICD-10-CM | POA: Diagnosis not present

## 2021-02-19 DIAGNOSIS — D485 Neoplasm of uncertain behavior of skin: Secondary | ICD-10-CM | POA: Diagnosis not present

## 2021-02-20 DIAGNOSIS — F4323 Adjustment disorder with mixed anxiety and depressed mood: Secondary | ICD-10-CM | POA: Diagnosis not present

## 2021-03-06 DIAGNOSIS — F4323 Adjustment disorder with mixed anxiety and depressed mood: Secondary | ICD-10-CM | POA: Diagnosis not present

## 2021-03-13 DIAGNOSIS — F4323 Adjustment disorder with mixed anxiety and depressed mood: Secondary | ICD-10-CM | POA: Diagnosis not present

## 2021-03-20 DIAGNOSIS — F4323 Adjustment disorder with mixed anxiety and depressed mood: Secondary | ICD-10-CM | POA: Diagnosis not present

## 2021-04-03 DIAGNOSIS — F4323 Adjustment disorder with mixed anxiety and depressed mood: Secondary | ICD-10-CM | POA: Diagnosis not present

## 2021-04-17 DIAGNOSIS — F4323 Adjustment disorder with mixed anxiety and depressed mood: Secondary | ICD-10-CM | POA: Diagnosis not present

## 2021-04-24 DIAGNOSIS — F4323 Adjustment disorder with mixed anxiety and depressed mood: Secondary | ICD-10-CM | POA: Diagnosis not present

## 2021-05-07 DIAGNOSIS — F4323 Adjustment disorder with mixed anxiety and depressed mood: Secondary | ICD-10-CM | POA: Diagnosis not present

## 2021-05-08 DIAGNOSIS — F4323 Adjustment disorder with mixed anxiety and depressed mood: Secondary | ICD-10-CM | POA: Diagnosis not present

## 2021-05-15 DIAGNOSIS — F4323 Adjustment disorder with mixed anxiety and depressed mood: Secondary | ICD-10-CM | POA: Diagnosis not present

## 2021-05-22 DIAGNOSIS — F4323 Adjustment disorder with mixed anxiety and depressed mood: Secondary | ICD-10-CM | POA: Diagnosis not present

## 2021-05-29 DIAGNOSIS — F4323 Adjustment disorder with mixed anxiety and depressed mood: Secondary | ICD-10-CM | POA: Diagnosis not present

## 2021-06-05 DIAGNOSIS — F4323 Adjustment disorder with mixed anxiety and depressed mood: Secondary | ICD-10-CM | POA: Diagnosis not present

## 2021-06-12 DIAGNOSIS — F4323 Adjustment disorder with mixed anxiety and depressed mood: Secondary | ICD-10-CM | POA: Diagnosis not present

## 2021-06-26 DIAGNOSIS — F4323 Adjustment disorder with mixed anxiety and depressed mood: Secondary | ICD-10-CM | POA: Diagnosis not present

## 2021-07-10 DIAGNOSIS — F4323 Adjustment disorder with mixed anxiety and depressed mood: Secondary | ICD-10-CM | POA: Diagnosis not present

## 2021-07-17 DIAGNOSIS — F4323 Adjustment disorder with mixed anxiety and depressed mood: Secondary | ICD-10-CM | POA: Diagnosis not present

## 2021-07-24 DIAGNOSIS — F4323 Adjustment disorder with mixed anxiety and depressed mood: Secondary | ICD-10-CM | POA: Diagnosis not present

## 2021-07-31 DIAGNOSIS — F4323 Adjustment disorder with mixed anxiety and depressed mood: Secondary | ICD-10-CM | POA: Diagnosis not present

## 2021-08-07 DIAGNOSIS — F4323 Adjustment disorder with mixed anxiety and depressed mood: Secondary | ICD-10-CM | POA: Diagnosis not present

## 2021-08-21 DIAGNOSIS — F4323 Adjustment disorder with mixed anxiety and depressed mood: Secondary | ICD-10-CM | POA: Diagnosis not present

## 2021-08-28 DIAGNOSIS — F4323 Adjustment disorder with mixed anxiety and depressed mood: Secondary | ICD-10-CM | POA: Diagnosis not present

## 2021-09-04 DIAGNOSIS — F4323 Adjustment disorder with mixed anxiety and depressed mood: Secondary | ICD-10-CM | POA: Diagnosis not present

## 2021-09-11 DIAGNOSIS — F4323 Adjustment disorder with mixed anxiety and depressed mood: Secondary | ICD-10-CM | POA: Diagnosis not present

## 2021-09-18 DIAGNOSIS — F4323 Adjustment disorder with mixed anxiety and depressed mood: Secondary | ICD-10-CM | POA: Diagnosis not present

## 2021-09-25 DIAGNOSIS — F4323 Adjustment disorder with mixed anxiety and depressed mood: Secondary | ICD-10-CM | POA: Diagnosis not present

## 2021-10-02 DIAGNOSIS — F4323 Adjustment disorder with mixed anxiety and depressed mood: Secondary | ICD-10-CM | POA: Diagnosis not present

## 2021-10-09 DIAGNOSIS — F4323 Adjustment disorder with mixed anxiety and depressed mood: Secondary | ICD-10-CM | POA: Diagnosis not present

## 2021-10-16 DIAGNOSIS — F4323 Adjustment disorder with mixed anxiety and depressed mood: Secondary | ICD-10-CM | POA: Diagnosis not present

## 2021-10-30 DIAGNOSIS — F4323 Adjustment disorder with mixed anxiety and depressed mood: Secondary | ICD-10-CM | POA: Diagnosis not present

## 2021-11-06 DIAGNOSIS — F4323 Adjustment disorder with mixed anxiety and depressed mood: Secondary | ICD-10-CM | POA: Diagnosis not present

## 2021-11-08 ENCOUNTER — Other Ambulatory Visit: Payer: Self-pay | Admitting: Physician Assistant

## 2021-11-08 DIAGNOSIS — K219 Gastro-esophageal reflux disease without esophagitis: Secondary | ICD-10-CM

## 2021-11-08 DIAGNOSIS — R1310 Dysphagia, unspecified: Secondary | ICD-10-CM

## 2021-11-13 DIAGNOSIS — F4323 Adjustment disorder with mixed anxiety and depressed mood: Secondary | ICD-10-CM | POA: Diagnosis not present

## 2021-11-20 DIAGNOSIS — F4323 Adjustment disorder with mixed anxiety and depressed mood: Secondary | ICD-10-CM | POA: Diagnosis not present

## 2021-11-27 DIAGNOSIS — F4323 Adjustment disorder with mixed anxiety and depressed mood: Secondary | ICD-10-CM | POA: Diagnosis not present

## 2021-12-11 DIAGNOSIS — F4323 Adjustment disorder with mixed anxiety and depressed mood: Secondary | ICD-10-CM | POA: Diagnosis not present

## 2021-12-18 DIAGNOSIS — F4323 Adjustment disorder with mixed anxiety and depressed mood: Secondary | ICD-10-CM | POA: Diagnosis not present

## 2022-01-01 DIAGNOSIS — F4323 Adjustment disorder with mixed anxiety and depressed mood: Secondary | ICD-10-CM | POA: Diagnosis not present

## 2022-01-08 DIAGNOSIS — F4323 Adjustment disorder with mixed anxiety and depressed mood: Secondary | ICD-10-CM | POA: Diagnosis not present

## 2022-01-22 DIAGNOSIS — F4323 Adjustment disorder with mixed anxiety and depressed mood: Secondary | ICD-10-CM | POA: Diagnosis not present

## 2022-02-12 DIAGNOSIS — F4323 Adjustment disorder with mixed anxiety and depressed mood: Secondary | ICD-10-CM | POA: Diagnosis not present

## 2022-02-19 DIAGNOSIS — F4323 Adjustment disorder with mixed anxiety and depressed mood: Secondary | ICD-10-CM | POA: Diagnosis not present

## 2022-02-24 ENCOUNTER — Other Ambulatory Visit (HOSPITAL_BASED_OUTPATIENT_CLINIC_OR_DEPARTMENT_OTHER): Payer: Self-pay | Admitting: Physician Assistant

## 2022-02-24 DIAGNOSIS — Z1231 Encounter for screening mammogram for malignant neoplasm of breast: Secondary | ICD-10-CM

## 2022-02-26 DIAGNOSIS — F4323 Adjustment disorder with mixed anxiety and depressed mood: Secondary | ICD-10-CM | POA: Diagnosis not present

## 2022-02-27 ENCOUNTER — Ambulatory Visit (INDEPENDENT_AMBULATORY_CARE_PROVIDER_SITE_OTHER): Payer: BC Managed Care – PPO

## 2022-02-27 DIAGNOSIS — Z1231 Encounter for screening mammogram for malignant neoplasm of breast: Secondary | ICD-10-CM | POA: Diagnosis not present

## 2022-03-03 NOTE — Progress Notes (Signed)
Normal mammogram. Follow up in 1 year.

## 2022-03-12 DIAGNOSIS — F4323 Adjustment disorder with mixed anxiety and depressed mood: Secondary | ICD-10-CM | POA: Diagnosis not present

## 2022-03-19 DIAGNOSIS — F4323 Adjustment disorder with mixed anxiety and depressed mood: Secondary | ICD-10-CM | POA: Diagnosis not present

## 2022-03-26 DIAGNOSIS — F4323 Adjustment disorder with mixed anxiety and depressed mood: Secondary | ICD-10-CM | POA: Diagnosis not present

## 2022-03-31 ENCOUNTER — Encounter: Payer: BC Managed Care – PPO | Admitting: Physician Assistant

## 2022-04-02 DIAGNOSIS — F4323 Adjustment disorder with mixed anxiety and depressed mood: Secondary | ICD-10-CM | POA: Diagnosis not present

## 2022-04-16 DIAGNOSIS — F4323 Adjustment disorder with mixed anxiety and depressed mood: Secondary | ICD-10-CM | POA: Diagnosis not present

## 2022-04-23 DIAGNOSIS — F4323 Adjustment disorder with mixed anxiety and depressed mood: Secondary | ICD-10-CM | POA: Diagnosis not present

## 2022-04-30 DIAGNOSIS — F4323 Adjustment disorder with mixed anxiety and depressed mood: Secondary | ICD-10-CM | POA: Diagnosis not present

## 2022-05-07 DIAGNOSIS — F4323 Adjustment disorder with mixed anxiety and depressed mood: Secondary | ICD-10-CM | POA: Diagnosis not present

## 2022-05-14 DIAGNOSIS — F4323 Adjustment disorder with mixed anxiety and depressed mood: Secondary | ICD-10-CM | POA: Diagnosis not present

## 2022-05-21 DIAGNOSIS — F4323 Adjustment disorder with mixed anxiety and depressed mood: Secondary | ICD-10-CM | POA: Diagnosis not present

## 2022-05-26 ENCOUNTER — Encounter: Payer: Self-pay | Admitting: Physician Assistant

## 2022-05-26 ENCOUNTER — Ambulatory Visit (INDEPENDENT_AMBULATORY_CARE_PROVIDER_SITE_OTHER): Payer: BC Managed Care – PPO | Admitting: Physician Assistant

## 2022-05-26 VITALS — BP 129/77 | HR 74 | Ht 64.0 in | Wt 150.0 lb

## 2022-05-26 DIAGNOSIS — R152 Fecal urgency: Secondary | ICD-10-CM

## 2022-05-26 DIAGNOSIS — E049 Nontoxic goiter, unspecified: Secondary | ICD-10-CM

## 2022-05-26 DIAGNOSIS — Z0000 Encounter for general adult medical examination without abnormal findings: Secondary | ICD-10-CM | POA: Diagnosis not present

## 2022-05-26 DIAGNOSIS — E7800 Pure hypercholesterolemia, unspecified: Secondary | ICD-10-CM

## 2022-05-26 DIAGNOSIS — Z1211 Encounter for screening for malignant neoplasm of colon: Secondary | ICD-10-CM

## 2022-05-26 DIAGNOSIS — R195 Other fecal abnormalities: Secondary | ICD-10-CM

## 2022-05-26 DIAGNOSIS — Z131 Encounter for screening for diabetes mellitus: Secondary | ICD-10-CM

## 2022-05-26 NOTE — Progress Notes (Unsigned)
Complete physical exam  Patient: Barbara Bauer   DOB: 25-Nov-1975   47 y.o. Female  MRN: [id number]  Subjective:    Chief Complaint  Patient presents with   Annual Exam    Barbara Bauer is a 47 y.o. female who presents today for a complete physical exam. She reports consuming a {diet types:17450} diet. {types:19826} She generally feels {DESC; WELL/FAIRLY WELL/POORLY:18703}. She reports sleeping {DESC; WELL/FAIRLY WELL/POORLY:18703}. She {does/does not:200015} have additional problems to discuss today.    Most recent fall risk assessment:    01/01/2021   10:09 AM  Fall Risk   Falls in the past year? 0  Number falls in past yr: 0  Injury with Fall? 0  Follow up Falls evaluation completed     Most recent depression screenings:    01/01/2021   10:09 AM 01/03/2020    1:02 PM  PHQ 2/9 Scores  PHQ - 2 Score 1 2  PHQ- 9 Score 2 6    {VISON DENTAL STD PSA (Optional):27386}  {History (Optional):23778}  Patient Care Team: Lavada Mesi as PCP - General (Family Medicine)   Outpatient Medications Prior to Visit  Medication Sig   cholecalciferol (VITAMIN D3) 25 MCG (1000 UT) tablet Take 1,000 Units by mouth daily.   Multiple Vitamin (MULTIVITAMIN) tablet Take 1 tablet by mouth daily.   omeprazole (PRILOSEC) 40 MG capsule Take 1 capsule (40 mg total) by mouth daily. Appt for further refills   Probiotic Product (PROBIOTIC DAILY PO) Take 1 capsule by mouth daily.    TYRVAYA 0.03 MG/ACT SOLN Place into both nostrils.   valACYclovir (VALTREX) 500 MG tablet Take 1 tablet (500 mg total) by mouth daily. Start at first sign of outbreak   [DISCONTINUED] buPROPion (WELLBUTRIN XL) 150 MG 24 hr tablet Take 1 tablet (150 mg total) by mouth daily.   [DISCONTINUED] cycloSPORINE (RESTASIS) 0.05 % ophthalmic emulsion 1 drop 2 (two) times daily.   [DISCONTINUED] ibuprofen (ADVIL,MOTRIN) 600 MG tablet Take 1 tablet (600 mg total) by mouth every 6 (six) hours as needed.   No  facility-administered medications prior to visit.    ROS        Objective:     BP 129/77   Pulse 74   Ht '5\' 4"'$  (1.626 m)   Wt 150 lb (68 kg)   SpO2 100%   BMI 25.75 kg/m  {Vitals History (Optional):23777}  Physical Exam   No results found for any visits on 05/26/22. {Show previous labs (optional):23779}    Assessment & Plan:    Routine Health Maintenance and Physical Exam  Immunization History  Administered Date(s) Administered   Moderna Sars-Covid-2 Vaccination 06/11/2019, 07/09/2019, 04/23/2020   Tdap 01/01/2021    Health Maintenance  Topic Date Due   COLONOSCOPY (Pts 45-56yr Insurance coverage will need to be confirmed)  Never done   COVID-19 Vaccine (4 - 2023-24 season) 06/11/2022 (Originally 12/27/2021)   INFLUENZA VACCINE  07/27/2022 (Originally 11/26/2021)   PAP SMEAR-Modifier  09/02/2022   MAMMOGRAM  02/28/2023   DTaP/Tdap/Td (2 - Td or Tdap) 01/02/2031   Hepatitis C Screening  Completed   HIV Screening  Completed   HPV VACCINES  Aged Out    Discussed health benefits of physical activity, and encouraged her to engage in regular exercise appropriate for her age and condition.  Problem List Items Addressed This Visit       Unprioritized   Thyroid goiter   Other Visit Diagnoses     Elevated LDL cholesterol level    -  Primary   Preventative health care       Screening for diabetes mellitus          No follow-ups on file.     Iran Planas, PA-C

## 2022-05-26 NOTE — Patient Instructions (Signed)

## 2022-05-27 ENCOUNTER — Encounter: Payer: Self-pay | Admitting: Physician Assistant

## 2022-05-27 DIAGNOSIS — R195 Other fecal abnormalities: Secondary | ICD-10-CM | POA: Insufficient documentation

## 2022-05-27 DIAGNOSIS — E7800 Pure hypercholesterolemia, unspecified: Secondary | ICD-10-CM | POA: Insufficient documentation

## 2022-05-27 DIAGNOSIS — R152 Fecal urgency: Secondary | ICD-10-CM | POA: Insufficient documentation

## 2022-05-27 LAB — CBC WITH DIFFERENTIAL/PLATELET
Absolute Monocytes: 681 cells/uL (ref 200–950)
Basophils Absolute: 66 cells/uL (ref 0–200)
Basophils Relative: 0.8 %
Eosinophils Absolute: 158 cells/uL (ref 15–500)
Eosinophils Relative: 1.9 %
HCT: 41.1 % (ref 35.0–45.0)
Hemoglobin: 13.8 g/dL (ref 11.7–15.5)
Lymphs Abs: 2266 cells/uL (ref 850–3900)
MCH: 28.2 pg (ref 27.0–33.0)
MCHC: 33.6 g/dL (ref 32.0–36.0)
MCV: 84 fL (ref 80.0–100.0)
MPV: 13.1 fL — ABNORMAL HIGH (ref 7.5–12.5)
Monocytes Relative: 8.2 %
Neutro Abs: 5129 cells/uL (ref 1500–7800)
Neutrophils Relative %: 61.8 %
Platelets: 319 10*3/uL (ref 140–400)
RBC: 4.89 10*6/uL (ref 3.80–5.10)
RDW: 12.4 % (ref 11.0–15.0)
Total Lymphocyte: 27.3 %
WBC: 8.3 10*3/uL (ref 3.8–10.8)

## 2022-05-27 LAB — COMPLETE METABOLIC PANEL WITH GFR
AG Ratio: 1.5 (calc) (ref 1.0–2.5)
ALT: 13 U/L (ref 6–29)
AST: 16 U/L (ref 10–35)
Albumin: 4.6 g/dL (ref 3.6–5.1)
Alkaline phosphatase (APISO): 79 U/L (ref 31–125)
BUN: 11 mg/dL (ref 7–25)
CO2: 27 mmol/L (ref 20–32)
Calcium: 10.3 mg/dL — ABNORMAL HIGH (ref 8.6–10.2)
Chloride: 103 mmol/L (ref 98–110)
Creat: 0.89 mg/dL (ref 0.50–0.99)
Globulin: 3.1 g/dL (calc) (ref 1.9–3.7)
Glucose, Bld: 87 mg/dL (ref 65–99)
Potassium: 4.3 mmol/L (ref 3.5–5.3)
Sodium: 141 mmol/L (ref 135–146)
Total Bilirubin: 0.6 mg/dL (ref 0.2–1.2)
Total Protein: 7.7 g/dL (ref 6.1–8.1)
eGFR: 81 mL/min/{1.73_m2} (ref >=60)

## 2022-05-27 LAB — LIPID PANEL W/REFLEX DIRECT LDL
Cholesterol: 272 mg/dL — ABNORMAL HIGH (ref <200)
HDL: 85 mg/dL (ref >=50)
LDL Cholesterol (Calc): 169 mg/dL (calc) — ABNORMAL HIGH
Non-HDL Cholesterol (Calc): 187 mg/dL (calc) — ABNORMAL HIGH (ref <130)
Total CHOL/HDL Ratio: 3.2 (calc) (ref <5.0)
Triglycerides: 74 mg/dL (ref <150)

## 2022-05-27 LAB — TSH: TSH: 1.72 mIU/L

## 2022-05-27 NOTE — Progress Notes (Signed)
Barbara Bauer,   Thyroid looks great.  Hemoglobin normal.  Kidney, liver, and glucose look great.  Calcium up just a little. Make sure taking vitamin D.  HDL, good cholesterol, is amazing.  LDL, bad cholesterol, is up quite a bit at 169.   Your 10 year risk is low at .8percent but your LDL goal is not under 130 which is goal for your current risk factors. Based on this the suggestion would be to consider a statin. Thoughts?   Marland KitchenMarland KitchenThe 10-year ASCVD risk score (Arnett DK, et al., 2019) is: 0.8%   Values used to calculate the score:     Age: 47 years     Sex: Female     Is Non-Hispanic African American: No     Diabetic: No     Tobacco smoker: No     Systolic Blood Pressure: 099 mmHg     Is BP treated: No     HDL Cholesterol: 85 mg/dL     Total Cholesterol: 272 mg/dL

## 2022-05-28 DIAGNOSIS — F4323 Adjustment disorder with mixed anxiety and depressed mood: Secondary | ICD-10-CM | POA: Diagnosis not present

## 2022-06-04 ENCOUNTER — Other Ambulatory Visit: Payer: Self-pay | Admitting: Physician Assistant

## 2022-06-04 DIAGNOSIS — F4323 Adjustment disorder with mixed anxiety and depressed mood: Secondary | ICD-10-CM | POA: Diagnosis not present

## 2022-06-04 DIAGNOSIS — K219 Gastro-esophageal reflux disease without esophagitis: Secondary | ICD-10-CM

## 2022-06-04 DIAGNOSIS — R1310 Dysphagia, unspecified: Secondary | ICD-10-CM

## 2022-06-11 DIAGNOSIS — F4323 Adjustment disorder with mixed anxiety and depressed mood: Secondary | ICD-10-CM | POA: Diagnosis not present

## 2022-06-18 DIAGNOSIS — F4323 Adjustment disorder with mixed anxiety and depressed mood: Secondary | ICD-10-CM | POA: Diagnosis not present

## 2022-06-25 DIAGNOSIS — F4323 Adjustment disorder with mixed anxiety and depressed mood: Secondary | ICD-10-CM | POA: Diagnosis not present

## 2022-07-01 DIAGNOSIS — K640 First degree hemorrhoids: Secondary | ICD-10-CM | POA: Diagnosis not present

## 2022-07-01 DIAGNOSIS — Z1211 Encounter for screening for malignant neoplasm of colon: Secondary | ICD-10-CM | POA: Diagnosis not present

## 2022-07-01 LAB — HM COLONOSCOPY

## 2022-07-02 DIAGNOSIS — F4323 Adjustment disorder with mixed anxiety and depressed mood: Secondary | ICD-10-CM | POA: Diagnosis not present

## 2022-07-09 DIAGNOSIS — F4323 Adjustment disorder with mixed anxiety and depressed mood: Secondary | ICD-10-CM | POA: Diagnosis not present

## 2022-07-16 ENCOUNTER — Encounter: Payer: Self-pay | Admitting: Physician Assistant

## 2022-07-16 DIAGNOSIS — F4323 Adjustment disorder with mixed anxiety and depressed mood: Secondary | ICD-10-CM | POA: Diagnosis not present

## 2022-07-30 DIAGNOSIS — F4323 Adjustment disorder with mixed anxiety and depressed mood: Secondary | ICD-10-CM | POA: Diagnosis not present

## 2022-08-06 DIAGNOSIS — F4323 Adjustment disorder with mixed anxiety and depressed mood: Secondary | ICD-10-CM | POA: Diagnosis not present

## 2022-08-11 DIAGNOSIS — H04123 Dry eye syndrome of bilateral lacrimal glands: Secondary | ICD-10-CM | POA: Diagnosis not present

## 2022-08-11 DIAGNOSIS — E049 Nontoxic goiter, unspecified: Secondary | ICD-10-CM | POA: Diagnosis not present

## 2022-08-11 DIAGNOSIS — R768 Other specified abnormal immunological findings in serum: Secondary | ICD-10-CM | POA: Diagnosis not present

## 2022-08-11 DIAGNOSIS — E7800 Pure hypercholesterolemia, unspecified: Secondary | ICD-10-CM | POA: Diagnosis not present

## 2022-08-12 DIAGNOSIS — H04123 Dry eye syndrome of bilateral lacrimal glands: Secondary | ICD-10-CM | POA: Diagnosis not present

## 2022-08-12 DIAGNOSIS — M25559 Pain in unspecified hip: Secondary | ICD-10-CM | POA: Diagnosis not present

## 2022-08-12 DIAGNOSIS — R768 Other specified abnormal immunological findings in serum: Secondary | ICD-10-CM | POA: Diagnosis not present

## 2022-08-13 DIAGNOSIS — F4323 Adjustment disorder with mixed anxiety and depressed mood: Secondary | ICD-10-CM | POA: Diagnosis not present

## 2022-08-25 DIAGNOSIS — E7800 Pure hypercholesterolemia, unspecified: Secondary | ICD-10-CM | POA: Diagnosis not present

## 2022-08-25 DIAGNOSIS — H04123 Dry eye syndrome of bilateral lacrimal glands: Secondary | ICD-10-CM | POA: Diagnosis not present

## 2022-08-25 DIAGNOSIS — E049 Nontoxic goiter, unspecified: Secondary | ICD-10-CM | POA: Diagnosis not present

## 2022-08-25 DIAGNOSIS — R768 Other specified abnormal immunological findings in serum: Secondary | ICD-10-CM | POA: Diagnosis not present

## 2022-09-03 DIAGNOSIS — F4323 Adjustment disorder with mixed anxiety and depressed mood: Secondary | ICD-10-CM | POA: Diagnosis not present

## 2022-09-10 DIAGNOSIS — F4323 Adjustment disorder with mixed anxiety and depressed mood: Secondary | ICD-10-CM | POA: Diagnosis not present

## 2022-09-17 DIAGNOSIS — F4323 Adjustment disorder with mixed anxiety and depressed mood: Secondary | ICD-10-CM | POA: Diagnosis not present

## 2022-09-24 DIAGNOSIS — F4323 Adjustment disorder with mixed anxiety and depressed mood: Secondary | ICD-10-CM | POA: Diagnosis not present

## 2022-10-15 DIAGNOSIS — F4323 Adjustment disorder with mixed anxiety and depressed mood: Secondary | ICD-10-CM | POA: Diagnosis not present

## 2022-10-24 DIAGNOSIS — F4323 Adjustment disorder with mixed anxiety and depressed mood: Secondary | ICD-10-CM | POA: Diagnosis not present

## 2022-10-28 DIAGNOSIS — D8989 Other specified disorders involving the immune mechanism, not elsewhere classified: Secondary | ICD-10-CM | POA: Diagnosis not present

## 2022-10-28 DIAGNOSIS — M6281 Muscle weakness (generalized): Secondary | ICD-10-CM | POA: Diagnosis not present

## 2022-10-28 DIAGNOSIS — R768 Other specified abnormal immunological findings in serum: Secondary | ICD-10-CM | POA: Diagnosis not present

## 2022-10-29 DIAGNOSIS — F4323 Adjustment disorder with mixed anxiety and depressed mood: Secondary | ICD-10-CM | POA: Diagnosis not present

## 2022-11-05 DIAGNOSIS — F4323 Adjustment disorder with mixed anxiety and depressed mood: Secondary | ICD-10-CM | POA: Diagnosis not present

## 2022-11-12 DIAGNOSIS — F4323 Adjustment disorder with mixed anxiety and depressed mood: Secondary | ICD-10-CM | POA: Diagnosis not present

## 2022-11-14 DIAGNOSIS — B078 Other viral warts: Secondary | ICD-10-CM | POA: Diagnosis not present

## 2022-11-14 DIAGNOSIS — L821 Other seborrheic keratosis: Secondary | ICD-10-CM | POA: Diagnosis not present

## 2022-11-18 DIAGNOSIS — F4323 Adjustment disorder with mixed anxiety and depressed mood: Secondary | ICD-10-CM | POA: Diagnosis not present

## 2022-11-24 IMAGING — MG MM DIGITAL SCREENING BILAT W/ TOMO AND CAD
8 series · 9 of 24 positions shown · non-contrast
Comparison: Previous exam(s).

CLINICAL DATA: Screening.

EXAM:
DIGITAL SCREENING BILATERAL MAMMOGRAM WITH TOMOSYNTHESIS AND CAD
TECHNIQUE: Bilateral screening digital craniocaudal and mediolateral oblique
mammograms were obtained. Bilateral screening digital breast
tomosynthesis was performed. The images were evaluated with
computer-aided detection.

[R CC synth-2D]
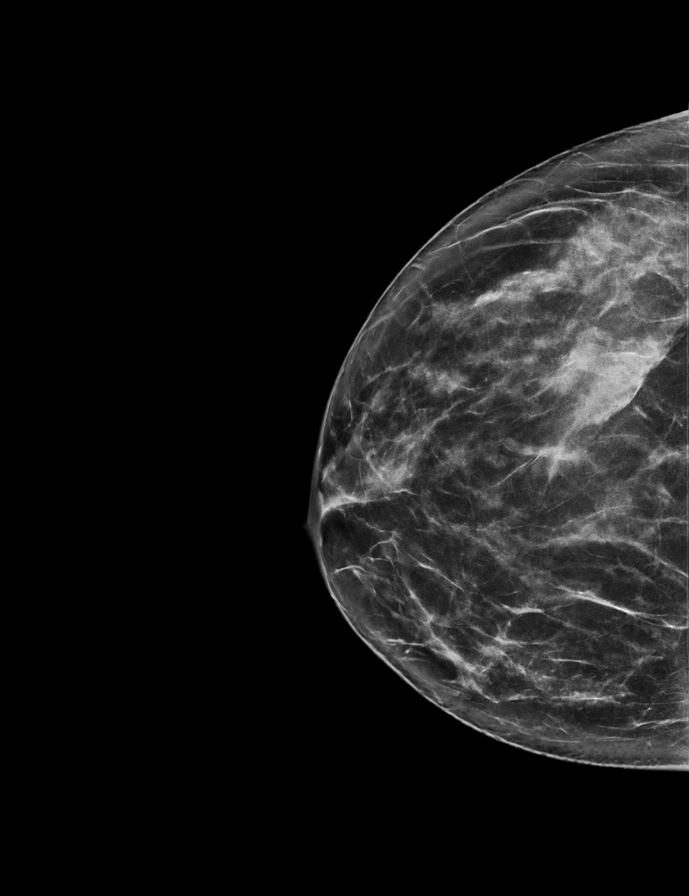

[L CC synth-2D]
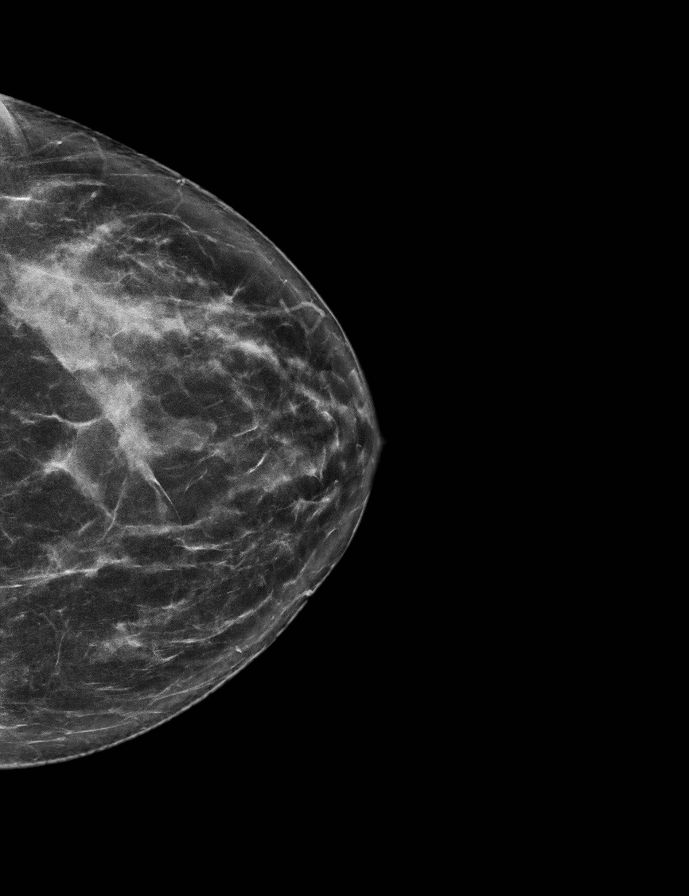

[R MLO synth-2D]
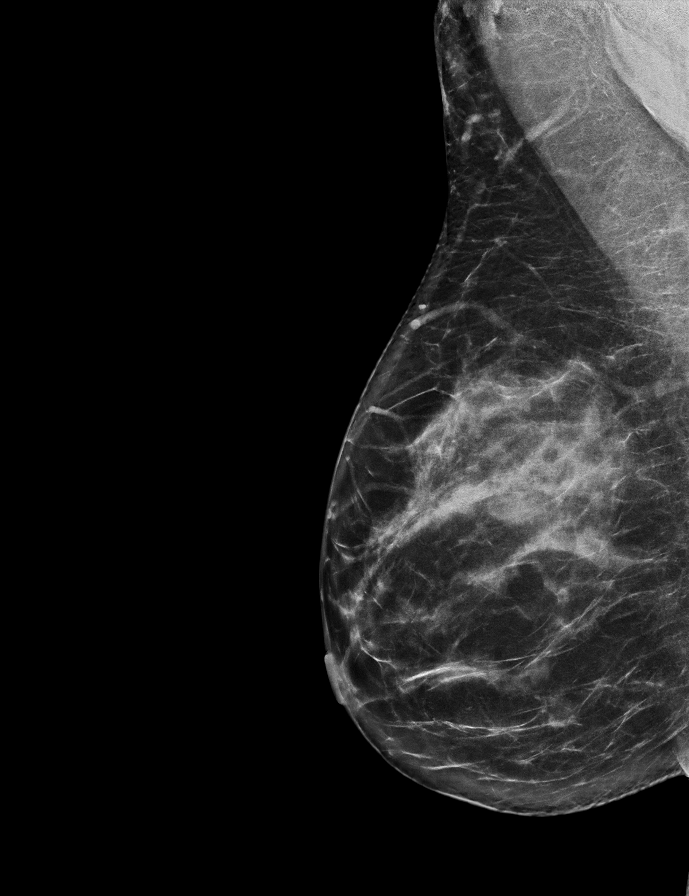

[L MLO synth-2D]
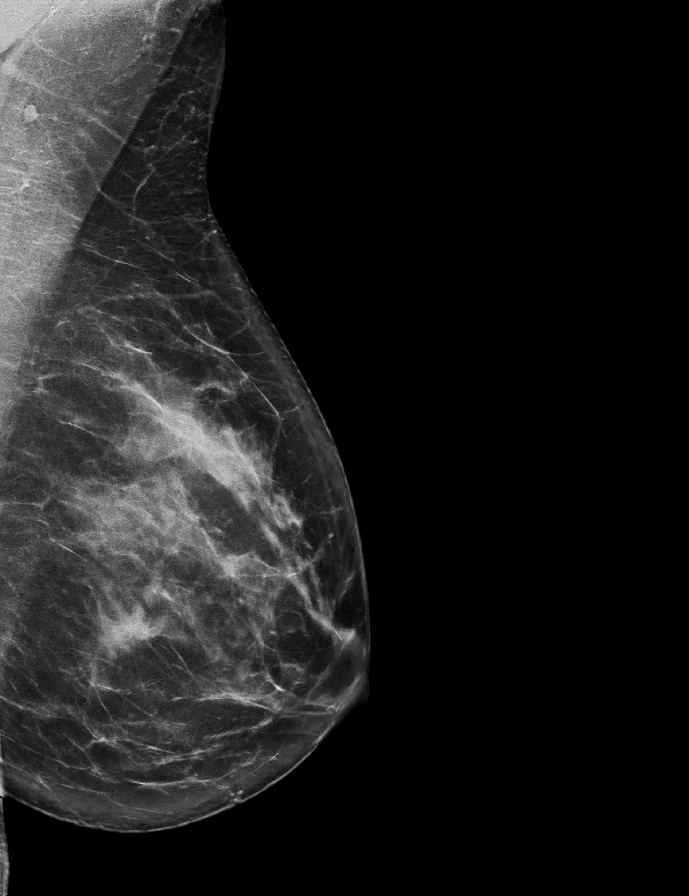

[R MLO tomo · 2 of 74 frames shown]
[frame 24/74]
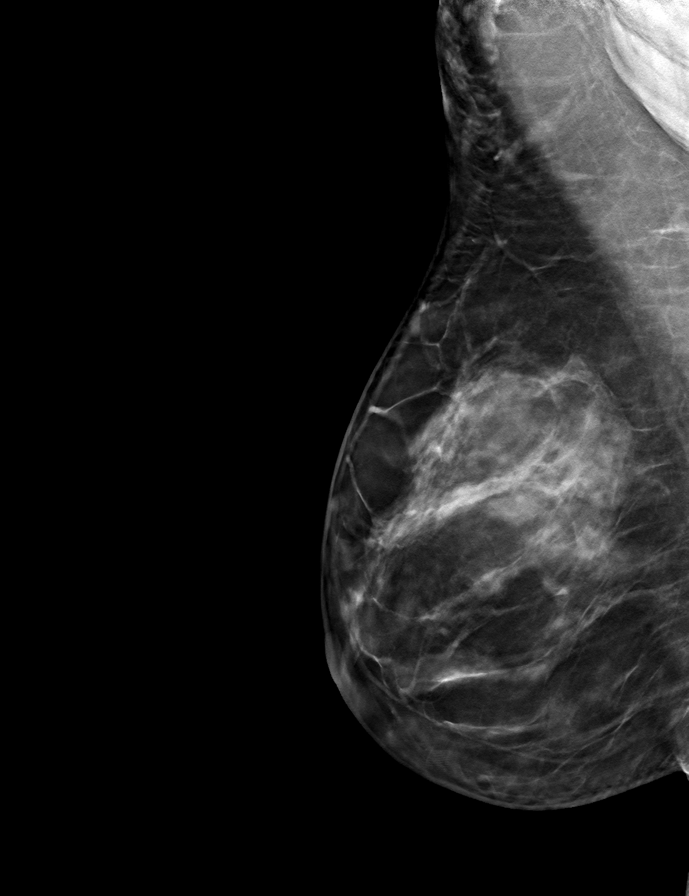
[frame 37/74]
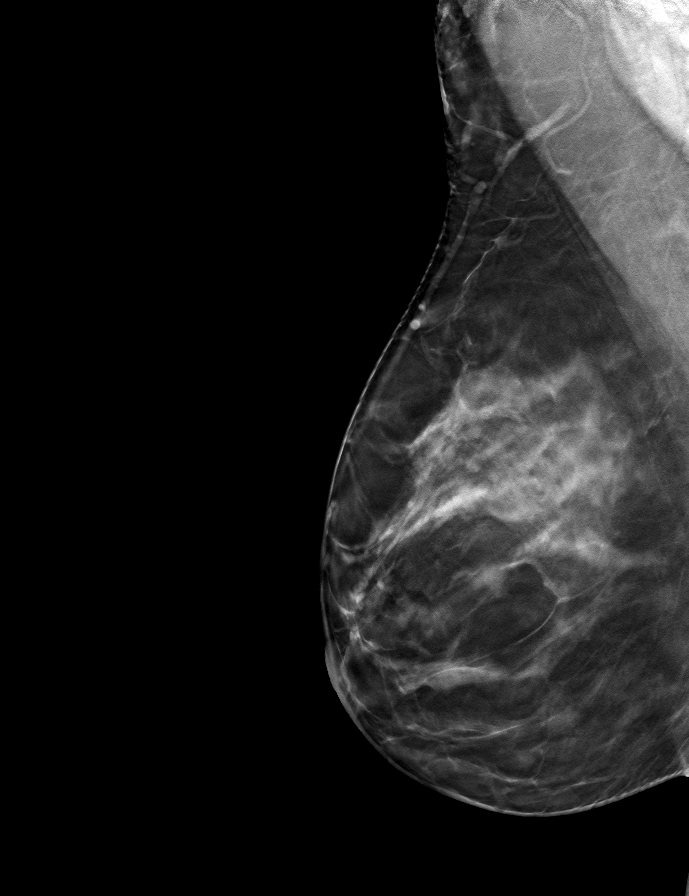

[R CC tomo · tomo slice 33/64.0]
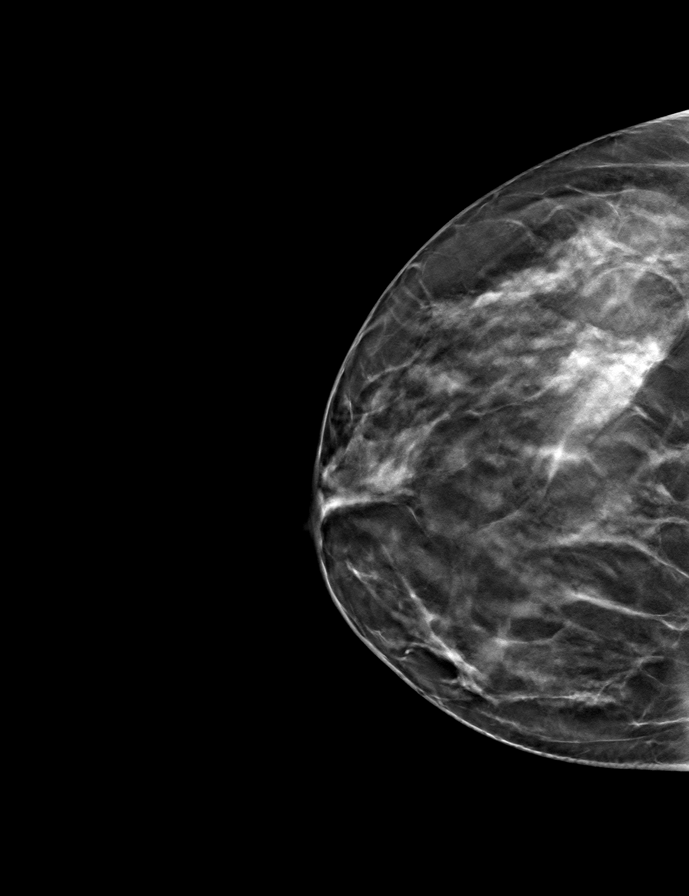

[L MLO tomo · tomo slice 39/77.0]
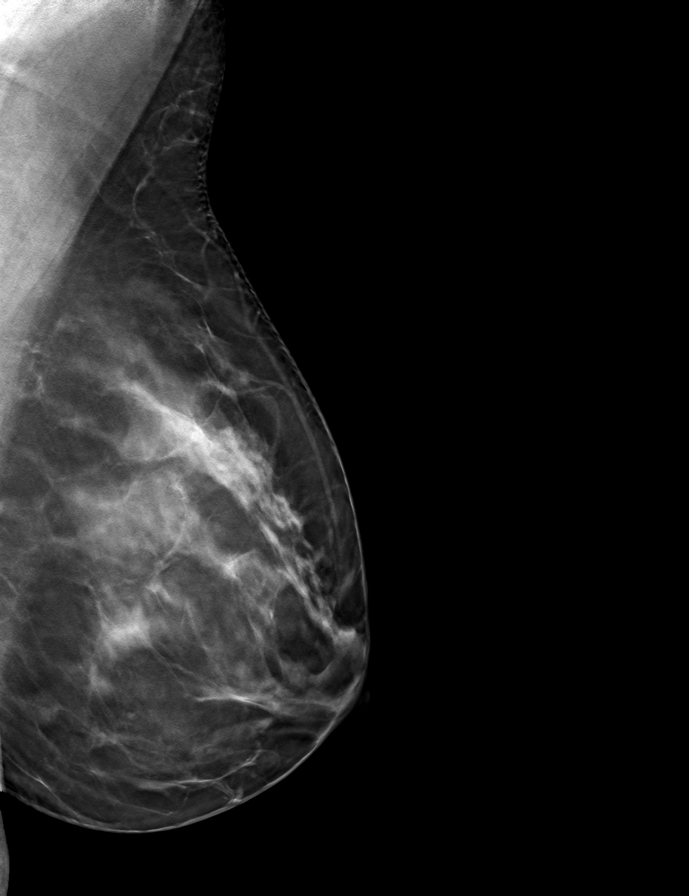

[L CC tomo · tomo slice 35/68.0]
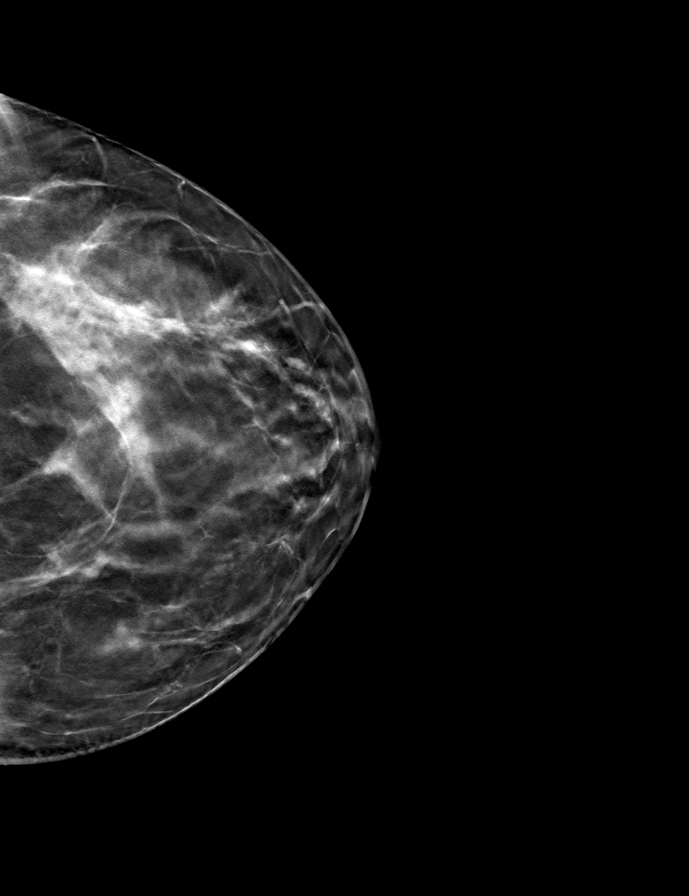

[9 of 24 positions shown; findings below may reference images not displayed]

ACR Breast Density Category c: The breast tissue is heterogeneously
dense, which may obscure small masses.
FINDINGS: There are no findings suspicious for malignancy.
IMPRESSION: No mammographic evidence of malignancy. A result letter of this
screening mammogram will be mailed directly to the patient.

RECOMMENDATION:
Screening mammogram in one year. (Code:[id number])

BI-RADS CATEGORY  1: Negative.

## 2022-11-26 DIAGNOSIS — F4323 Adjustment disorder with mixed anxiety and depressed mood: Secondary | ICD-10-CM | POA: Diagnosis not present

## 2022-12-03 DIAGNOSIS — F4323 Adjustment disorder with mixed anxiety and depressed mood: Secondary | ICD-10-CM | POA: Diagnosis not present

## 2022-12-10 DIAGNOSIS — F4323 Adjustment disorder with mixed anxiety and depressed mood: Secondary | ICD-10-CM | POA: Diagnosis not present

## 2022-12-17 DIAGNOSIS — F4323 Adjustment disorder with mixed anxiety and depressed mood: Secondary | ICD-10-CM | POA: Diagnosis not present

## 2022-12-24 DIAGNOSIS — F4323 Adjustment disorder with mixed anxiety and depressed mood: Secondary | ICD-10-CM | POA: Diagnosis not present

## 2022-12-31 DIAGNOSIS — F4323 Adjustment disorder with mixed anxiety and depressed mood: Secondary | ICD-10-CM | POA: Diagnosis not present

## 2023-01-07 DIAGNOSIS — F4323 Adjustment disorder with mixed anxiety and depressed mood: Secondary | ICD-10-CM | POA: Diagnosis not present

## 2023-01-21 DIAGNOSIS — F4323 Adjustment disorder with mixed anxiety and depressed mood: Secondary | ICD-10-CM | POA: Diagnosis not present

## 2023-02-04 DIAGNOSIS — F4323 Adjustment disorder with mixed anxiety and depressed mood: Secondary | ICD-10-CM | POA: Diagnosis not present

## 2023-02-11 DIAGNOSIS — F4323 Adjustment disorder with mixed anxiety and depressed mood: Secondary | ICD-10-CM | POA: Diagnosis not present

## 2023-02-18 DIAGNOSIS — F4323 Adjustment disorder with mixed anxiety and depressed mood: Secondary | ICD-10-CM | POA: Diagnosis not present

## 2023-02-25 DIAGNOSIS — F4323 Adjustment disorder with mixed anxiety and depressed mood: Secondary | ICD-10-CM | POA: Diagnosis not present

## 2023-02-27 ENCOUNTER — Encounter: Payer: Self-pay | Admitting: Physician Assistant

## 2023-02-27 ENCOUNTER — Ambulatory Visit
Admission: EM | Admit: 2023-02-27 | Discharge: 2023-02-27 | Disposition: A | Discharge status: Home / Self Care | Payer: BC Managed Care – PPO | Attending: Family Medicine | Admitting: Family Medicine

## 2023-02-27 ENCOUNTER — Telehealth (INDEPENDENT_AMBULATORY_CARE_PROVIDER_SITE_OTHER): Payer: BC Managed Care – PPO | Admitting: Physician Assistant

## 2023-02-27 ENCOUNTER — Encounter: Payer: Self-pay | Admitting: Emergency Medicine

## 2023-02-27 VITALS — BP 110/64 | HR 89

## 2023-02-27 DIAGNOSIS — N951 Menopausal and female climacteric states: Secondary | ICD-10-CM

## 2023-02-27 DIAGNOSIS — K219 Gastro-esophageal reflux disease without esophagitis: Secondary | ICD-10-CM

## 2023-02-27 DIAGNOSIS — E7800 Pure hypercholesterolemia, unspecified: Secondary | ICD-10-CM

## 2023-02-27 DIAGNOSIS — R002 Palpitations: Secondary | ICD-10-CM

## 2023-02-27 DIAGNOSIS — R635 Abnormal weight gain: Secondary | ICD-10-CM

## 2023-02-27 DIAGNOSIS — Z79890 Hormone replacement therapy (postmenopausal): Secondary | ICD-10-CM | POA: Diagnosis not present

## 2023-02-27 NOTE — Discharge Instructions (Addendum)
Advised patient EKG was within normal limits today and similar in appearance to previous EKG of 09/26/2016.  Advised patient if symptoms worsen and/or unresolved please follow-up with PCP for further evaluation.  Encouraged increase daily water intake to 64 ounces per day 7 days/week.

## 2023-02-27 NOTE — ED Provider Notes (Signed)
[facility]    CSN: [id number] Arrival date & time: 02/27/23  1207      History   Chief Complaint Chief Complaint  Patient presents with   Tachycardia    HPI Barbara Bauer is a 47 y.o. female.   HPI 47 year old female presents with chest tightness and palpitations upon awaking this morning.  Patient reports EMS came to her house and elevated her which made her feel better.Marland Kitchen  PMH significant for thyroid goiter, GERD, and palpitations.  Past Medical History:  Diagnosis Date   Edema leg    Eosinophilia    Genital warts    GERD (gastroesophageal reflux disease)    Goiter    MD watching - no treatment   HPV in female    Hyperlipidemia    diet controlled   Thyroid goiter     Patient Active Problem List   Diagnosis Date Noted   Hormone replacement therapy (HRT) 02/27/2023   Palpitations 02/27/2023   Increased mucus in stool 05/27/2022   Fecal urgency 05/27/2022   Elevated LDL cholesterol level 05/27/2022   Perimenopausal 01/01/2021   Irritable 04/28/2019   Mood changes 04/28/2019   Depressed mood 04/28/2019   Hypercalcemia 09/01/2018   Missed period 08/31/2018   Soreness breast 08/31/2018   Hot flashes 08/31/2018   No energy 10/09/2017   Dysphagia 10/09/2017   History of herpes genitalis 10/02/2017   Adjustment insomnia 07/30/2016   Adjustment disorder with mixed anxiety and depressed mood 07/30/2016   Abnormal weight gain 09/05/2015   Night sweats 09/05/2015   Bilateral edema of lower extremity 09/05/2015   Eosinophilia 09/18/2014   Cervical intraepithelial neoplasia grade 1 04/18/2014   Abnormal Papanicolaou smear of cervix with positive human papilloma virus (HPV) test 04/18/2014   HPV test positive 04/13/2013   Thyroid goiter 03/16/2013   Hx of abnormal Pap smear 03/16/2013   GERD (gastroesophageal reflux disease) 03/16/2013   General counselling and advice on contraception 02/16/2013    Past Surgical History:  Procedure Laterality Date    ESOPHAGOGASTRODUODENOSCOPY ENDOSCOPY     EYE SURGERY Bilateral    lasik    LAPAROSCOPIC TUBAL LIGATION Bilateral 10/03/2016   Procedure: LAPAROSCOPIC TUBAL LIGATION;  Surgeon: Lesly Dukes, MD;  Location: [facility];  Service: Gynecology;  Laterality: Bilateral;   tummy tuck 2023     UPPER GASTROINTESTINAL ENDOSCOPY     WISDOM TOOTH EXTRACTION      OB History     Gravida  0   Para  0   Term  0   Preterm  0   AB  0   Living  0      SAB  0   IAB  0   Ectopic  0   Multiple  0   Live Births               Home Medications    Prior to Admission medications   Medication Sig Start Date End Date Taking? Authorizing Provider  cholecalciferol (VITAMIN D3) 25 MCG (1000 UT) tablet Take 1,000 Units by mouth daily.   Yes [provider]  estradiol (CLIMARA - DOSED IN MG/24 HR) 0.075 mg/24hr patch Place 0.075 mg onto the skin once a week.   Yes [provider]  Multiple Vitamin (MULTIVITAMIN) tablet Take 1 tablet by mouth daily.   Yes [provider]  omeprazole (PRILOSEC) 40 MG capsule TAKE 1 CAPSULE BY MOUTH ONCE DAILY . APPOINTMENT REQUIRED FOR FUTURE REFILLS 06/06/22  Yes Breeback, Jade L, PA-C  Probiotic Product (PROBIOTIC DAILY PO) Take 1 capsule by mouth daily.    Yes [provider]  progesterone (PROMETRIUM) 100 MG capsule Take 100 mg by mouth daily.   Yes [provider]  TYRVAYA 0.03 MG/ACT SOLN Place into both nostrils. 01/09/22  Yes [provider]  valACYclovir (VALTREX) 500 MG tablet Take 1 tablet (500 mg total) by mouth daily. Start at first sign of outbreak 01/01/21  Yes Breeback, Lonna Cobb, PA-C    Family History Family History  Problem Relation Age of Onset   Other Father        Pityriasis Ruba Pilaris   Healthy Mother    Breast cancer Maternal Grandmother    Heart attack Maternal Grandfather    Colon cancer Neg Hx    Esophageal cancer Neg Hx    Stomach cancer Neg Hx    Pancreatic cancer Neg Hx     Colon polyps Neg Hx    Diabetes Neg Hx    Kidney disease Neg Hx    Liver disease Neg Hx    Rectal cancer Neg Hx     Social History Social History   Tobacco Use   Smoking status: Never   Smokeless tobacco: Never  Vaping Use   Vaping status: Never Used  Substance Use Topics   Alcohol use: Yes    Alcohol/week: 0.0 standard drinks of alcohol    Comment: minimal   Drug use: No     Allergies   Penicillins   Review of Systems Review of Systems  Cardiovascular:  Positive for palpitations.     Physical Exam Triage Vital Signs ED Triage Vitals  Encounter Vitals Group     BP      Systolic BP Percentile      Diastolic BP Percentile      Pulse      Resp      Temp      Temp src      SpO2      Weight      Height      Head Circumference      Peak Flow      Pain Score      Pain Loc      Pain Education      Exclude from Growth Chart    No data found.  Updated Vital Signs BP 125/77 (BP Location: Right Arm)   Pulse 86   Temp 98.3 F (36.8 C) (Oral)   Resp 18   Ht 5\' 4"  (1.626 m)   Wt 160 lb (72.6 kg)   LMP 02/21/2023 (Exact Date)   SpO2 99%   BMI 27.46 kg/m   Physical Exam Vitals and nursing note reviewed.  Constitutional:      General: Barbara Bauer is not in acute distress.    Appearance: Normal appearance. Barbara Bauer is obese. Barbara Bauer is not ill-appearing, toxic-appearing or diaphoretic.  HENT:     Head: Normocephalic and atraumatic.     Mouth/Throat:     Mouth: Mucous membranes are moist.     Pharynx: Oropharynx is clear.  Eyes:     Extraocular Movements: Extraocular movements intact.     Conjunctiva/sclera: Conjunctivae normal.     Pupils: Pupils are equal, round, and reactive to light.  Cardiovascular:     Rate and Rhythm: Normal rate and regular rhythm.     Pulses: Normal pulses.     Heart sounds: Normal heart sounds. No murmur heard.    No friction rub. No gallop.  Comments: S1-S2 normal Pulmonary:     Effort: Pulmonary effort is normal.     Breath sounds:  Normal breath sounds. No wheezing, rhonchi or rales.  Musculoskeletal:        General: Normal range of motion.     Cervical back: Normal range of motion and neck supple.  Skin:    General: Skin is warm and dry.  Neurological:     General: No focal deficit present.     Mental Status: Barbara Bauer is alert and oriented to person, place, and time. Mental status is at baseline.  Psychiatric:        Mood and Affect: Mood normal.        Behavior: Behavior normal.      UC Treatments / Results  Labs (all labs ordered are listed, but only abnormal results are displayed) Labs Reviewed - No data to display  EKG   Radiology No results found.  Procedures Procedures (including critical care time)  Medications Ordered in UC Medications - No data to display  Initial Impression / Assessment and Plan / UC Course  I have reviewed the triage vital signs and the nursing notes.  Pertinent labs & imaging results that were available during my care of the patient were reviewed by me and considered in my medical decision making (see chart for details).     MDM: 1.  Palpitations-EKG revealed normal sinus rhythm nearly identical to previous EKG performed with PCP on 09/26/2016. Advised patient EKG was within normal limits today and similar in appearance to previous EKG of 09/26/2016.  Advised patient if symptoms worsen and/or unresolved please follow-up with PCP for further evaluation.  Encouraged increase daily water intake to 64 ounces per day 7 days/week.  Patient discharged home, hemodynamically stable. Final Clinical Impressions(s) / UC Diagnoses   Final diagnoses:  Palpitations     Discharge Instructions      Advised patient EKG was within normal limits today and similar in appearance to previous EKG of 09/26/2016.  Advised patient if symptoms worsen and/or unresolved please follow-up with PCP for further evaluation.  Encouraged increase daily water intake to 64 ounces per day 7  days/week.     ED Prescriptions   None    PDMP not reviewed this encounter.   Trevor Iha, FNP 02/27/23 1314

## 2023-02-27 NOTE — Progress Notes (Unsigned)
.Virtual Visit via Video Note  I connected with Barbara Bauer on 03/01/23 at  9:10 AM EDT by a video enabled telemedicine application and verified that I am speaking with the correct person using two identifiers.  Location: Patient: home Provider: clinic  .Marland KitchenParticipating in visit:  Patient: Barbara Bauer Provider: Tandy Gaw PA-C   I discussed the limitations of evaluation and management by telemedicine and the availability of in person appointments. The patient expressed understanding and agreed to proceed.  History of Present Illness: Pt is a 47 yo female who is on HRT and continues to struggle with menopausal symptoms:brain fog, palpitations, weight gain, hot flashes, trouble sleeping.   She is aware of the side effects of HRT. Her first palpitations were this morning and she ended up calling EMS to be evaluated. Everything checked out and palpitations resolved. She is just concerned about everything right now. Denies any current SOB, CP, chest tightness. No leg swelling or pain.   .. 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 04/18/2014   Eosinophilia 09/18/2014   Abnormal weight gain 09/05/2015   Night sweats 09/05/2015   Bilateral edema of lower extremity 09/05/2015   Adjustment insomnia 07/30/2016   Adjustment disorder with mixed anxiety and depressed mood 07/30/2016   History of herpes genitalis 10/02/2017   No energy 10/09/2017   Dysphagia 10/09/2017   Missed period 08/31/2018   Soreness breast 08/31/2018   Hot flashes 08/31/2018   Hypercalcemia 09/01/2018   Irritable 04/28/2019   Mood changes 04/28/2019    Depressed mood 04/28/2019   Menopausal symptoms 01/01/2021   Increased mucus in stool 05/27/2022   Fecal urgency 05/27/2022   Elevated LDL cholesterol level 05/27/2022   Hormone replacement therapy (HRT) 02/27/2023   Palpitations 02/27/2023   Resolved Ambulatory Problems    Diagnosis Date Noted   No Resolved Ambulatory Problems   Past Medical History:  Diagnosis Date   Edema leg    Genital warts    Goiter    HPV in female    Hyperlipidemia        Observations/Objective: No acute distress Normal breathing Normal mood and appearance  ..    05/26/2022    3:26 PM 01/01/2021   10:09 AM 01/03/2020    1:02 PM 04/28/2019   12:34 PM 10/09/2017    3:07 PM  Depression screen PHQ 2/9  Decreased Interest 0 0 1 0 0  Down, Depressed, Hopeless 0 1 1 2  0  PHQ - 2 Score 0 1 2 2  0  Altered sleeping 0 1 0 0 2  Tired, decreased energy 0 0 2 0 1  Change in appetite 0 0 0 0 0  Feeling bad or failure about yourself  0 0 1 0 0  Trouble concentrating 0 0 1 1 0  Moving slowly or fidgety/restless 0 0 0 0 0  Suicidal thoughts 0 0 0 0 0  PHQ-9 Score 0 2 6 3 3   Difficult doing work/chores Not difficult at all Not difficult at all Somewhat difficult Somewhat difficult Not difficult at all   ..    05/26/2022    3:26 PM 01/01/2021   10:09 AM 01/03/2020  1:04 PM 04/28/2019   12:35 PM  GAD 7 : Generalized Anxiety Score  Nervous, Anxious, on Edge 0 1 1 1   Control/stop worrying 0 1 0 1  Worry too much - different things 0 1 1 1   Trouble relaxing 0 1 1 0  Restless 0 1 0 0  Easily annoyed or irritable 0 1 1 1   Afraid - awful might happen 0 0 0 0  Total GAD 7 Score 0 6 4 4   Anxiety Difficulty Not difficult at all Not difficult at all Not difficult at all Somewhat difficult       Assessment and Plan: Marland KitchenMarland KitchenDiagnoses and all orders for this visit:  Palpitations -     Lipid panel -     CMP14+EGFR -     TSH -     CBC w/Diff/Platelet -     Fe+TIBC+Fer -     Estradiol -     Testosterone -      Progesterone -     VITAMIN D 25 Hydroxy (Vit-D Deficiency, Fractures) -     B12 and Folate Panel  Hormone replacement therapy (HRT) -     Lipid panel -     CMP14+EGFR -     TSH -     CBC w/Diff/Platelet -     Fe+TIBC+Fer -     Estradiol -     Testosterone -     Progesterone -     VITAMIN D 25 Hydroxy (Vit-D Deficiency, Fractures) -     B12 and Folate Panel  Elevated LDL cholesterol level -     Lipid panel -     CMP14+EGFR  Abnormal weight gain -     Lipid panel -     CMP14+EGFR -     TSH  Menopausal symptoms  Dysphagia -     omeprazole (PRILOSEC) 40 MG capsule; TAKE 1 CAPSULE BY MOUTH ONCE DAILY .  Gastroesophageal reflux disease -     omeprazole (PRILOSEC) 40 MG capsule; TAKE 1 CAPSULE BY MOUTH ONCE DAILY .   Discussed palpitations and possible causes such as thyroid problems, anemia etc.  Labs ordered today Continue same medications currently Pt is on HRT will recheck levels Discussed dehydration and stress as other causes of palpitations.   If palpitations continue consider heart monitor to get a better look at what is going on  .Marland KitchenDiscussed low carb diet with 1500 calories and 80g of protein.  Exercising at least 150 minutes a week.  [employer]itness Pal could be a Chief Technology Officer.  Encouraged regular exercise Discussed wegovy and mounjaro Will consider if no change in weight in the future    Follow Up Instructions:    I discussed the assessment and treatment plan with the patient. The patient was provided an opportunity to ask questions and all were answered. The patient agreed with the plan and demonstrated an understanding of the instructions.   The patient was advised to call back or seek an in-person evaluation if the symptoms worsen or if the condition fails to improve as anticipated.   Tandy Gaw, PA-C

## 2023-02-27 NOTE — ED Triage Notes (Signed)
Patient c/o heart racing this morning when she woke up.  EMS came out and elevated her, felt better.  Now patient is having some chest tightness, lightheadedness and just not feeling right.  Patient did not take any medication today.

## 2023-02-28 LAB — CBC WITH DIFFERENTIAL/PLATELET
Basophils Absolute: 0.1 10*3/uL (ref 0.0–0.2)
Basos: 1 %
EOS (ABSOLUTE): 0 10*3/uL (ref 0.0–0.4)
Eos: 0 %
Hematocrit: 43.6 % (ref 34.0–46.6)
Hemoglobin: 14.3 g/dL (ref 11.1–15.9)
Immature Grans (Abs): 0 10*3/uL (ref 0.0–0.1)
Immature Granulocytes: 0 %
Lymphocytes Absolute: 1.6 10*3/uL (ref 0.7–3.1)
Lymphs: 15 %
MCH: 28.6 pg (ref 26.6–33.0)
MCHC: 32.8 g/dL (ref 31.5–35.7)
MCV: 87 fL (ref 79–97)
Monocytes Absolute: 0.7 10*3/uL (ref 0.1–0.9)
Monocytes: 6 %
Neutrophils Absolute: 8.5 10*3/uL — ABNORMAL HIGH (ref 1.4–7.0)
Neutrophils: 78 %
Platelets: 399 10*3/uL (ref 150–450)
RBC: 5 x10E6/uL (ref 3.77–5.28)
RDW: 11.9 % (ref 11.7–15.4)
WBC: 11 10*3/uL — ABNORMAL HIGH (ref 3.4–10.8)

## 2023-02-28 LAB — CMP14+EGFR
ALT: 17 [IU]/L (ref 0–32)
AST: 23 [IU]/L (ref 0–40)
Albumin: 4.5 g/dL (ref 3.9–4.9)
Alkaline Phosphatase: 79 [IU]/L (ref 44–121)
BUN/Creatinine Ratio: 16 (ref 9–23)
BUN: 14 mg/dL (ref 6–24)
Bilirubin Total: 0.6 mg/dL (ref 0.0–1.2)
CO2: 24 mmol/L (ref 20–29)
Calcium: 9.6 mg/dL (ref 8.7–10.2)
Chloride: 100 mmol/L (ref 96–106)
Creatinine, Ser: 0.87 mg/dL (ref 0.57–1.00)
Globulin, Total: 2.9 g/dL (ref 1.5–4.5)
Glucose: 92 mg/dL (ref 70–99)
Potassium: 4.3 mmol/L (ref 3.5–5.2)
Sodium: 139 mmol/L (ref 134–144)
Total Protein: 7.4 g/dL (ref 6.0–8.5)
eGFR: 83 mL/min/{1.73_m2} (ref >59)

## 2023-02-28 LAB — B12 AND FOLATE PANEL
Folate: 13.5 ng/mL (ref >3.0)
Vitamin B-12: 909 pg/mL (ref 232–1245)

## 2023-02-28 LAB — LIPID PANEL
Chol/HDL Ratio: 3.8 ratio (ref 0.0–4.4)
Cholesterol, Total: 246 mg/dL — ABNORMAL HIGH (ref 100–199)
HDL: 64 mg/dL (ref >39)
LDL Chol Calc (NIH): 170 mg/dL — ABNORMAL HIGH (ref 0–99)
Triglycerides: 72 mg/dL (ref 0–149)
VLDL Cholesterol Cal: 12 mg/dL (ref 5–40)

## 2023-02-28 LAB — TESTOSTERONE: Testosterone: 38 ng/dL (ref 4–50)

## 2023-02-28 LAB — PROGESTERONE: Progesterone: 1.1 ng/mL

## 2023-02-28 LAB — ESTRADIOL: Estradiol: 34.4 pg/mL

## 2023-02-28 LAB — IRON,TIBC AND FERRITIN PANEL
Ferritin: 38 ng/mL (ref 15–150)
Iron Saturation: 33 % (ref 15–55)
Iron: 124 ug/dL (ref 27–159)
Total Iron Binding Capacity: 371 ug/dL (ref 250–450)
UIBC: 247 ug/dL (ref 131–425)

## 2023-02-28 LAB — VITAMIN D 25 HYDROXY (VIT D DEFICIENCY, FRACTURES): Vit D, 25-Hydroxy: 40.3 ng/mL (ref 30.0–100.0)

## 2023-02-28 LAB — TSH: TSH: 0.671 u[IU]/mL (ref 0.450–4.500)

## 2023-03-01 ENCOUNTER — Encounter: Payer: Self-pay | Admitting: Physician Assistant

## 2023-03-01 MED ORDER — OMEPRAZOLE 40 MG PO CPDR: DELAYED_RELEASE_CAPSULE | ORAL | 3 refills | Status: DC

## 2023-03-01 NOTE — Patient Instructions (Signed)
Palpitations Palpitations are feelings that your heartbeat is not normal. Your heartbeat may feel like it is: Uneven (irregular). Faster than normal. Fluttering. Skipping a beat. This is usually not a serious problem. However, a doctor will do tests and check your medical history to make sure that you do not have a serious heart problem. Follow these instructions at home: Watch for any changes in your condition. Tell your doctor about any changes. Take these actions to help manage your symptoms: Eating and drinking Follow instructions from your doctor about things to eat and drink. You may be told to avoid these things: Drinks that have caffeine in them, such as coffee, tea, soft drinks, and energy drinks. Chocolate. Alcohol. Diet pills. Lifestyle     Try to lower your stress. These things can help you relax: Yoga. Deep breathing and meditation. Guided imagery. This is using words and images to create positive thoughts. Exercise, including swimming, jogging, and walking. Tell your doctor if you have more abnormal heartbeats when you are active. If you have chest pain or feel short of breath with exercise, do not keep doing the exercise until you are seen by your doctor. Biofeedback. This is using your mind to control things in your body, such as your heartbeat. Get plenty of rest and sleep. Keep a regular bed time. Do not use drugs, such as cocaine or ecstasy. Do not use marijuana. Do not smoke or use any products that contain nicotine or tobacco. If you need help quitting, ask your doctor. General instructions Take over-the-counter and prescription medicines only as told by your doctor. Keep all follow-up visits. You may need more tests if palpitations do not go away or get worse. Contact a doctor if: You keep having fast or uneven heartbeats for a long time. Your symptoms happen more often. Get help right away if: You have chest pain. You feel short of breath. You have a very  bad headache. You feel dizzy. You faint. These symptoms may be an emergency. Get help right away. Call your local emergency services (911 in the [place] Do not wait to see if the symptoms will go away. Do not drive yourself to the hospital. Summary Palpitations are feelings that your heartbeat is uneven or faster than normal. It may feel like your heart is fluttering or skipping a beat. Avoid food and drinks that may cause this condition. These include caffeine, chocolate, and alcohol. Try to lower your stress. Do not smoke or use drugs. Get help right away if you faint, feel dizzy, feel short of breath, have chest pain, or have a very bad headache. This information is not intended to replace advice given to you by your health care provider. Make sure you discuss any questions you have with your health care provider. Document Revised: 09/05/2020 Document Reviewed: 09/05/2020 Elsevier Patient Education  2024 [employer]  

## 2023-03-02 NOTE — Progress Notes (Signed)
Aydia,   Kidney, liver, glucose look good.  Thyroid normal range but closer to HYPER thyroid side. HYPER can cause palpitations. Recheck in 6 weeks to watch trend.  Vitamin B12 and folate look great! Vitamin D looks good.  Testosterone where it needs to be   Estrogen level much lower than 4 years ago but not absent or in post menopausal range.   HDL, good cholesterol, looks good.  LDL, bad cholesterol, elevated and not improved.  Overall 10 year risk under 7.5 percent you have been working on diet and you do exercise so I would suggest considering a low dose statin to get LDL lower. Thoughts?   Marland KitchenMarland KitchenThe 10-year ASCVD risk score (Arnett DK, et al., 2019) is: 1%   Values used to calculate the score:     Age: 47 years     Sex: Female     Is Non-Hispanic African American: No     Diabetic: No     Tobacco smoker: No     Systolic Blood Pressure: 125 mmHg     Is BP treated: No     HDL Cholesterol: 64 mg/dL     Total Cholesterol: 246 mg/dL  WBC up a little like you could be fighting a virus. How are you feeling?

## 2023-03-04 DIAGNOSIS — F4323 Adjustment disorder with mixed anxiety and depressed mood: Secondary | ICD-10-CM | POA: Diagnosis not present

## 2023-03-11 ENCOUNTER — Other Ambulatory Visit: Payer: Self-pay | Admitting: Physician Assistant

## 2023-03-11 DIAGNOSIS — Z8619 Personal history of other infectious and parasitic diseases: Secondary | ICD-10-CM

## 2023-03-11 DIAGNOSIS — Z79890 Hormone replacement therapy (postmenopausal): Secondary | ICD-10-CM

## 2023-03-11 DIAGNOSIS — N951 Menopausal and female climacteric states: Secondary | ICD-10-CM

## 2023-03-11 DIAGNOSIS — K2100 Gastro-esophageal reflux disease with esophagitis, without bleeding: Secondary | ICD-10-CM

## 2023-03-11 DIAGNOSIS — F4323 Adjustment disorder with mixed anxiety and depressed mood: Secondary | ICD-10-CM | POA: Diagnosis not present

## 2023-03-11 MED ORDER — ESTRADIOL 0.075 MG/24HR TD PTWK: 0.0750 mg | MEDICATED_PATCH | TRANSDERMAL | 3 refills | Status: AC

## 2023-03-11 MED ORDER — VALACYCLOVIR HCL 500 MG PO TABS: 500.0000 mg | ORAL_TABLET | Freq: Every day | ORAL | 3 refills | Status: AC

## 2023-03-11 MED ORDER — PROGESTERONE MICRONIZED 100 MG PO CAPS: 100.0000 mg | ORAL_CAPSULE | Freq: Every day | ORAL | 3 refills | Status: AC

## 2023-03-16 DIAGNOSIS — R768 Other specified abnormal immunological findings in serum: Secondary | ICD-10-CM | POA: Diagnosis not present

## 2023-03-23 DIAGNOSIS — F4323 Adjustment disorder with mixed anxiety and depressed mood: Secondary | ICD-10-CM | POA: Diagnosis not present

## 2023-03-30 DIAGNOSIS — Z1330 Encounter for screening examination for mental health and behavioral disorders, unspecified: Secondary | ICD-10-CM | POA: Diagnosis not present

## 2023-03-30 DIAGNOSIS — R002 Palpitations: Secondary | ICD-10-CM | POA: Diagnosis not present

## 2023-03-30 DIAGNOSIS — E7849 Other hyperlipidemia: Secondary | ICD-10-CM | POA: Diagnosis not present

## 2023-03-30 DIAGNOSIS — R0602 Shortness of breath: Secondary | ICD-10-CM | POA: Diagnosis not present

## 2023-03-30 DIAGNOSIS — I739 Peripheral vascular disease, unspecified: Secondary | ICD-10-CM | POA: Diagnosis not present

## 2023-04-07 DIAGNOSIS — R002 Palpitations: Secondary | ICD-10-CM | POA: Diagnosis not present

## 2023-04-07 DIAGNOSIS — E785 Hyperlipidemia, unspecified: Secondary | ICD-10-CM | POA: Diagnosis not present

## 2023-04-08 DIAGNOSIS — F4323 Adjustment disorder with mixed anxiety and depressed mood: Secondary | ICD-10-CM | POA: Diagnosis not present

## 2023-04-10 ENCOUNTER — Other Ambulatory Visit: Payer: Self-pay | Admitting: Physician Assistant

## 2023-04-10 DIAGNOSIS — Z1231 Encounter for screening mammogram for malignant neoplasm of breast: Secondary | ICD-10-CM

## 2023-04-15 DIAGNOSIS — F4323 Adjustment disorder with mixed anxiety and depressed mood: Secondary | ICD-10-CM | POA: Diagnosis not present

## 2023-04-15 DIAGNOSIS — R0602 Shortness of breath: Secondary | ICD-10-CM | POA: Diagnosis not present

## 2023-04-15 DIAGNOSIS — R002 Palpitations: Secondary | ICD-10-CM | POA: Diagnosis not present

## 2023-04-16 DIAGNOSIS — Z1231 Encounter for screening mammogram for malignant neoplasm of breast: Secondary | ICD-10-CM

## 2023-04-27 DIAGNOSIS — I739 Peripheral vascular disease, unspecified: Secondary | ICD-10-CM | POA: Diagnosis not present

## 2023-05-01 DIAGNOSIS — F4323 Adjustment disorder with mixed anxiety and depressed mood: Secondary | ICD-10-CM | POA: Diagnosis not present

## 2023-05-04 DIAGNOSIS — K219 Gastro-esophageal reflux disease without esophagitis: Secondary | ICD-10-CM | POA: Diagnosis not present

## 2023-05-04 DIAGNOSIS — K224 Dyskinesia of esophagus: Secondary | ICD-10-CM | POA: Diagnosis not present

## 2023-05-04 DIAGNOSIS — R1310 Dysphagia, unspecified: Secondary | ICD-10-CM | POA: Diagnosis not present

## 2023-05-06 DIAGNOSIS — F4323 Adjustment disorder with mixed anxiety and depressed mood: Secondary | ICD-10-CM | POA: Diagnosis not present

## 2023-05-13 DIAGNOSIS — F4323 Adjustment disorder with mixed anxiety and depressed mood: Secondary | ICD-10-CM | POA: Diagnosis not present

## 2023-05-19 DIAGNOSIS — R002 Palpitations: Secondary | ICD-10-CM | POA: Diagnosis not present

## 2023-05-20 DIAGNOSIS — F4323 Adjustment disorder with mixed anxiety and depressed mood: Secondary | ICD-10-CM | POA: Diagnosis not present

## 2023-05-25 ENCOUNTER — Other Ambulatory Visit: Payer: Self-pay | Admitting: Physician Assistant

## 2023-05-25 DIAGNOSIS — Z1231 Encounter for screening mammogram for malignant neoplasm of breast: Secondary | ICD-10-CM

## 2023-05-27 DIAGNOSIS — F4323 Adjustment disorder with mixed anxiety and depressed mood: Secondary | ICD-10-CM | POA: Diagnosis not present

## 2023-06-01 DIAGNOSIS — R1319 Other dysphagia: Secondary | ICD-10-CM | POA: Diagnosis not present

## 2023-06-01 DIAGNOSIS — K317 Polyp of stomach and duodenum: Secondary | ICD-10-CM | POA: Diagnosis not present

## 2023-06-03 DIAGNOSIS — F4323 Adjustment disorder with mixed anxiety and depressed mood: Secondary | ICD-10-CM | POA: Diagnosis not present

## 2023-06-10 ENCOUNTER — Ambulatory Visit: Payer: BC Managed Care – PPO

## 2023-06-10 DIAGNOSIS — Z1231 Encounter for screening mammogram for malignant neoplasm of breast: Secondary | ICD-10-CM

## 2023-06-10 DIAGNOSIS — F4323 Adjustment disorder with mixed anxiety and depressed mood: Secondary | ICD-10-CM | POA: Diagnosis not present

## 2023-06-12 ENCOUNTER — Encounter: Payer: Self-pay | Admitting: Physician Assistant

## 2023-06-12 NOTE — Progress Notes (Signed)
Normal mammogram. Follow up in 1 year.

## 2023-06-17 DIAGNOSIS — F4323 Adjustment disorder with mixed anxiety and depressed mood: Secondary | ICD-10-CM | POA: Diagnosis not present

## 2023-06-24 DIAGNOSIS — F4323 Adjustment disorder with mixed anxiety and depressed mood: Secondary | ICD-10-CM | POA: Diagnosis not present

## 2023-07-08 DIAGNOSIS — F4323 Adjustment disorder with mixed anxiety and depressed mood: Secondary | ICD-10-CM | POA: Diagnosis not present

## 2023-07-15 DIAGNOSIS — F4323 Adjustment disorder with mixed anxiety and depressed mood: Secondary | ICD-10-CM | POA: Diagnosis not present

## 2023-07-21 DIAGNOSIS — F4323 Adjustment disorder with mixed anxiety and depressed mood: Secondary | ICD-10-CM | POA: Diagnosis not present

## 2023-07-29 DIAGNOSIS — F4323 Adjustment disorder with mixed anxiety and depressed mood: Secondary | ICD-10-CM | POA: Diagnosis not present

## 2023-08-04 DIAGNOSIS — F4323 Adjustment disorder with mixed anxiety and depressed mood: Secondary | ICD-10-CM | POA: Diagnosis not present

## 2023-08-06 DIAGNOSIS — K224 Dyskinesia of esophagus: Secondary | ICD-10-CM | POA: Diagnosis not present

## 2023-08-06 DIAGNOSIS — K219 Gastro-esophageal reflux disease without esophagitis: Secondary | ICD-10-CM | POA: Diagnosis not present

## 2023-08-06 DIAGNOSIS — R1310 Dysphagia, unspecified: Secondary | ICD-10-CM | POA: Diagnosis not present

## 2023-08-12 DIAGNOSIS — F4323 Adjustment disorder with mixed anxiety and depressed mood: Secondary | ICD-10-CM | POA: Diagnosis not present

## 2023-08-26 DIAGNOSIS — F4323 Adjustment disorder with mixed anxiety and depressed mood: Secondary | ICD-10-CM | POA: Diagnosis not present

## 2023-08-27 DIAGNOSIS — M810 Age-related osteoporosis without current pathological fracture: Secondary | ICD-10-CM | POA: Diagnosis not present

## 2023-09-09 DIAGNOSIS — F4323 Adjustment disorder with mixed anxiety and depressed mood: Secondary | ICD-10-CM | POA: Diagnosis not present

## 2023-09-16 DIAGNOSIS — F4323 Adjustment disorder with mixed anxiety and depressed mood: Secondary | ICD-10-CM | POA: Diagnosis not present

## 2023-09-23 DIAGNOSIS — F4323 Adjustment disorder with mixed anxiety and depressed mood: Secondary | ICD-10-CM | POA: Diagnosis not present

## 2023-09-30 DIAGNOSIS — F4323 Adjustment disorder with mixed anxiety and depressed mood: Secondary | ICD-10-CM | POA: Diagnosis not present

## 2023-10-14 DIAGNOSIS — F4323 Adjustment disorder with mixed anxiety and depressed mood: Secondary | ICD-10-CM | POA: Diagnosis not present

## 2023-10-28 DIAGNOSIS — F4323 Adjustment disorder with mixed anxiety and depressed mood: Secondary | ICD-10-CM | POA: Diagnosis not present

## 2023-11-03 DIAGNOSIS — L814 Other melanin hyperpigmentation: Secondary | ICD-10-CM | POA: Diagnosis not present

## 2023-11-03 DIAGNOSIS — B078 Other viral warts: Secondary | ICD-10-CM | POA: Diagnosis not present

## 2023-11-04 DIAGNOSIS — F4323 Adjustment disorder with mixed anxiety and depressed mood: Secondary | ICD-10-CM | POA: Diagnosis not present

## 2023-11-11 DIAGNOSIS — F4323 Adjustment disorder with mixed anxiety and depressed mood: Secondary | ICD-10-CM | POA: Diagnosis not present

## 2023-11-18 DIAGNOSIS — F4323 Adjustment disorder with mixed anxiety and depressed mood: Secondary | ICD-10-CM | POA: Diagnosis not present

## 2023-11-25 DIAGNOSIS — F4323 Adjustment disorder with mixed anxiety and depressed mood: Secondary | ICD-10-CM | POA: Diagnosis not present

## 2023-12-02 DIAGNOSIS — F4323 Adjustment disorder with mixed anxiety and depressed mood: Secondary | ICD-10-CM | POA: Diagnosis not present

## 2023-12-23 DIAGNOSIS — F4323 Adjustment disorder with mixed anxiety and depressed mood: Secondary | ICD-10-CM | POA: Diagnosis not present

## 2023-12-30 DIAGNOSIS — F4323 Adjustment disorder with mixed anxiety and depressed mood: Secondary | ICD-10-CM | POA: Diagnosis not present

## 2024-01-06 DIAGNOSIS — F4323 Adjustment disorder with mixed anxiety and depressed mood: Secondary | ICD-10-CM | POA: Diagnosis not present

## 2024-01-13 DIAGNOSIS — F4323 Adjustment disorder with mixed anxiety and depressed mood: Secondary | ICD-10-CM | POA: Diagnosis not present

## 2024-01-20 DIAGNOSIS — F4323 Adjustment disorder with mixed anxiety and depressed mood: Secondary | ICD-10-CM | POA: Diagnosis not present

## 2024-01-27 DIAGNOSIS — F4323 Adjustment disorder with mixed anxiety and depressed mood: Secondary | ICD-10-CM | POA: Diagnosis not present

## 2024-02-15 ENCOUNTER — Other Ambulatory Visit: Payer: Self-pay | Admitting: Physician Assistant

## 2024-02-15 DIAGNOSIS — Z1231 Encounter for screening mammogram for malignant neoplasm of breast: Secondary | ICD-10-CM

## 2024-03-02 DIAGNOSIS — F4323 Adjustment disorder with mixed anxiety and depressed mood: Secondary | ICD-10-CM | POA: Diagnosis not present

## 2024-03-23 DIAGNOSIS — F4323 Adjustment disorder with mixed anxiety and depressed mood: Secondary | ICD-10-CM | POA: Diagnosis not present

## 2024-03-30 DIAGNOSIS — F4323 Adjustment disorder with mixed anxiety and depressed mood: Secondary | ICD-10-CM | POA: Diagnosis not present

## 2024-04-05 ENCOUNTER — Other Ambulatory Visit: Payer: Self-pay | Admitting: Physician Assistant

## 2024-04-05 DIAGNOSIS — K219 Gastro-esophageal reflux disease without esophagitis: Secondary | ICD-10-CM

## 2024-04-06 DIAGNOSIS — F4323 Adjustment disorder with mixed anxiety and depressed mood: Secondary | ICD-10-CM | POA: Diagnosis not present

## 2024-04-13 DIAGNOSIS — E785 Hyperlipidemia, unspecified: Secondary | ICD-10-CM | POA: Diagnosis not present

## 2024-04-13 DIAGNOSIS — F4323 Adjustment disorder with mixed anxiety and depressed mood: Secondary | ICD-10-CM | POA: Diagnosis not present

## 2024-04-27 DIAGNOSIS — F4323 Adjustment disorder with mixed anxiety and depressed mood: Secondary | ICD-10-CM | POA: Diagnosis not present
